# Patient Record
Sex: Male | Born: 1945 | Race: Black or African American | Hispanic: No | Smoking: Current every day smoker
Health system: Southern US, Community
[De-identification: ages and names within clinical notes are randomized; demographics above are authoritative.]

## PROBLEM LIST (undated history)

## (undated) DIAGNOSIS — I639 Cerebral infarction, unspecified: Secondary | ICD-10-CM

## (undated) DIAGNOSIS — E119 Type 2 diabetes mellitus without complications: Secondary | ICD-10-CM

## (undated) DIAGNOSIS — I10 Essential (primary) hypertension: Secondary | ICD-10-CM

---

## 2007-10-09 ENCOUNTER — Emergency Department: Payer: Self-pay | Admitting: Emergency Medicine

## 2007-10-09 ENCOUNTER — Ambulatory Visit: Payer: Self-pay | Admitting: Pulmonary Disease

## 2007-10-09 ENCOUNTER — Inpatient Hospital Stay (HOSPITAL_COMMUNITY): Admission: AD | Admit: 2007-10-09 | Discharge: 2007-11-10 | Payer: Self-pay | Admitting: Neurology

## 2007-10-28 ENCOUNTER — Ambulatory Visit: Payer: Self-pay | Admitting: Physical Medicine & Rehabilitation

## 2009-07-03 IMAGING — CR DG CHEST 1V PORT
1 series · 1 of 1 positions shown · non-contrast
Comparison: none

REASON FOR EXAM: pain
COMMENTS:

[view not recorded]
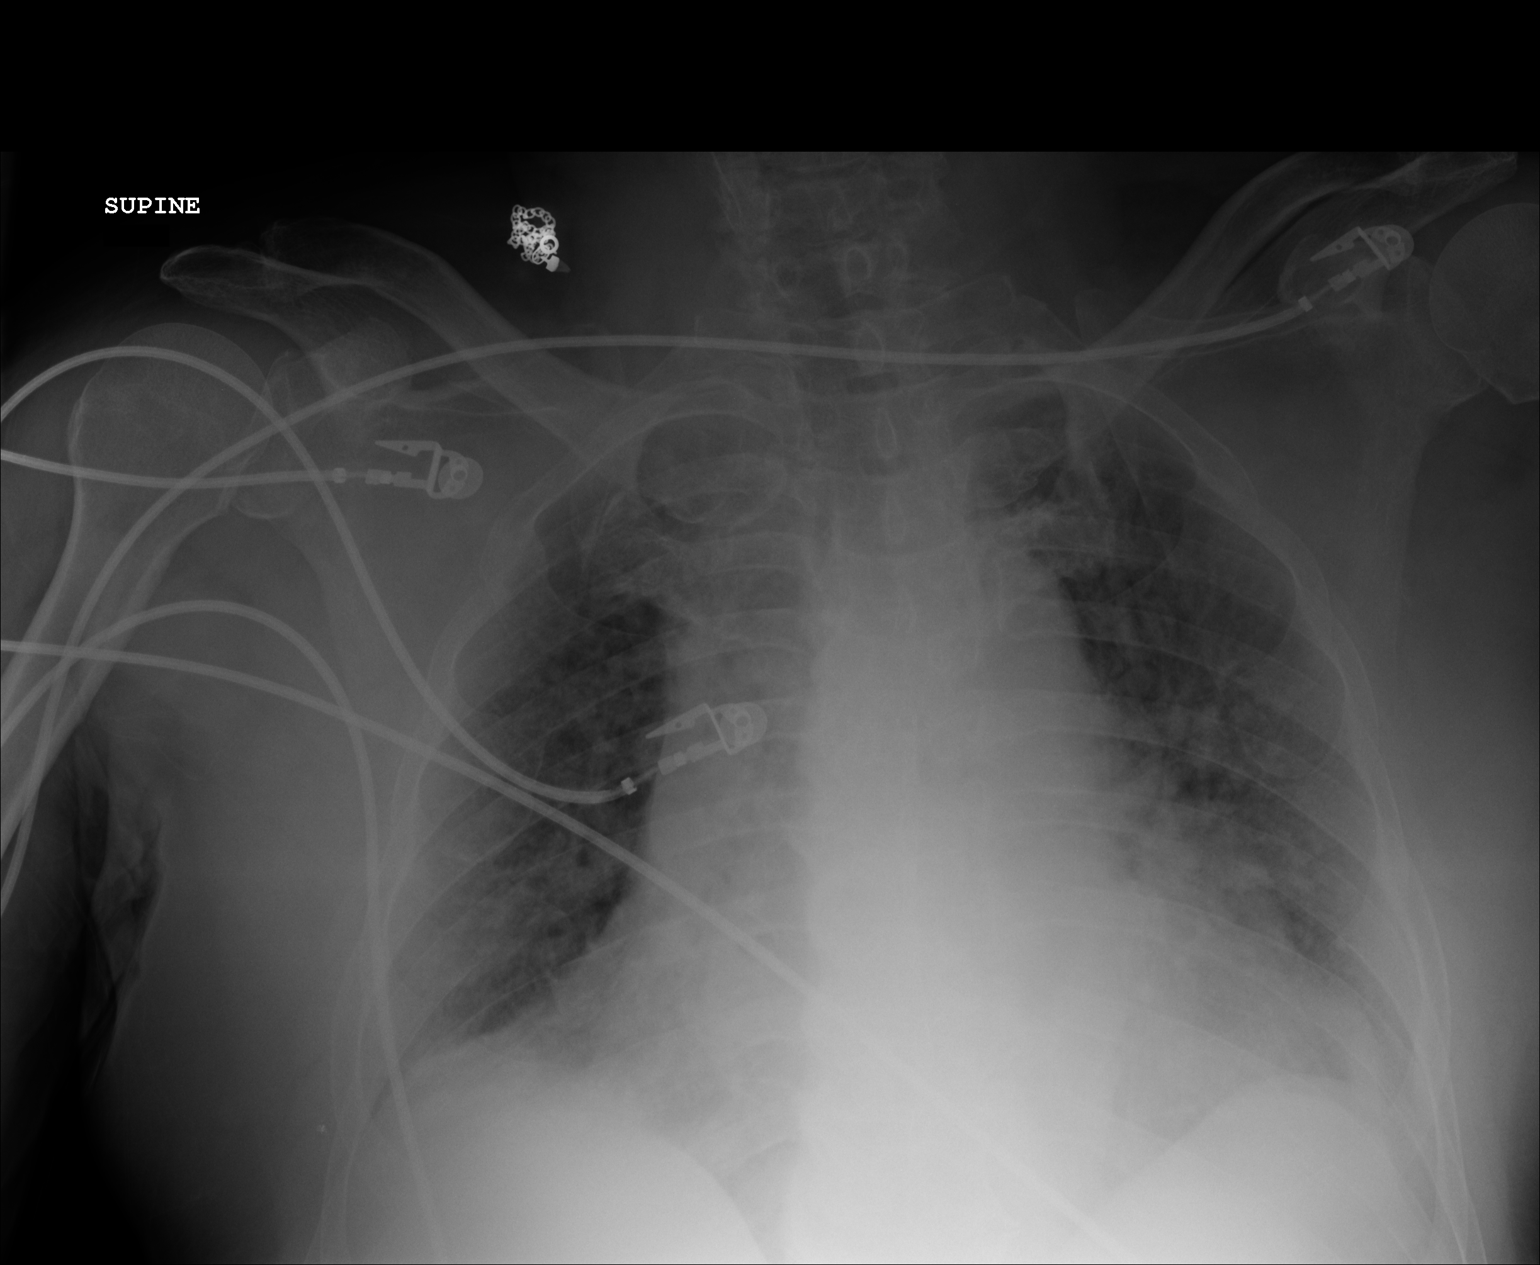

[1 of 1 positions shown; findings below may reference images not displayed]

PROCEDURE:     DXR - DXR PORTABLE CHEST SINGLE VIEW  - October 09, 2007  [DATE]

RESULT:     Portable AP view of the chest shows prominence of the pulmonary
vascularity compatible pulmonary vascular congestion. No pleural effusion or
frank pulmonary edema is seen. The heart is enlarged. Monitoring electrodes
are present.
IMPRESSION: 1. There is prominence of the pulmonary vascularity compatible pulmonary
vascular congestion.
2. No pneumonia is seen.
3. Cardiomegaly.

## 2009-07-06 IMAGING — CR DG ABD PORTABLE 1V
1 series · 1 of 1 positions shown · non-contrast
Comparison: 10/12/2007

CLINICAL DATA: Interventricular hemorrhage.  Pain the tube
placement.

ABDOMEN - 1 VIEW

[view not recorded]
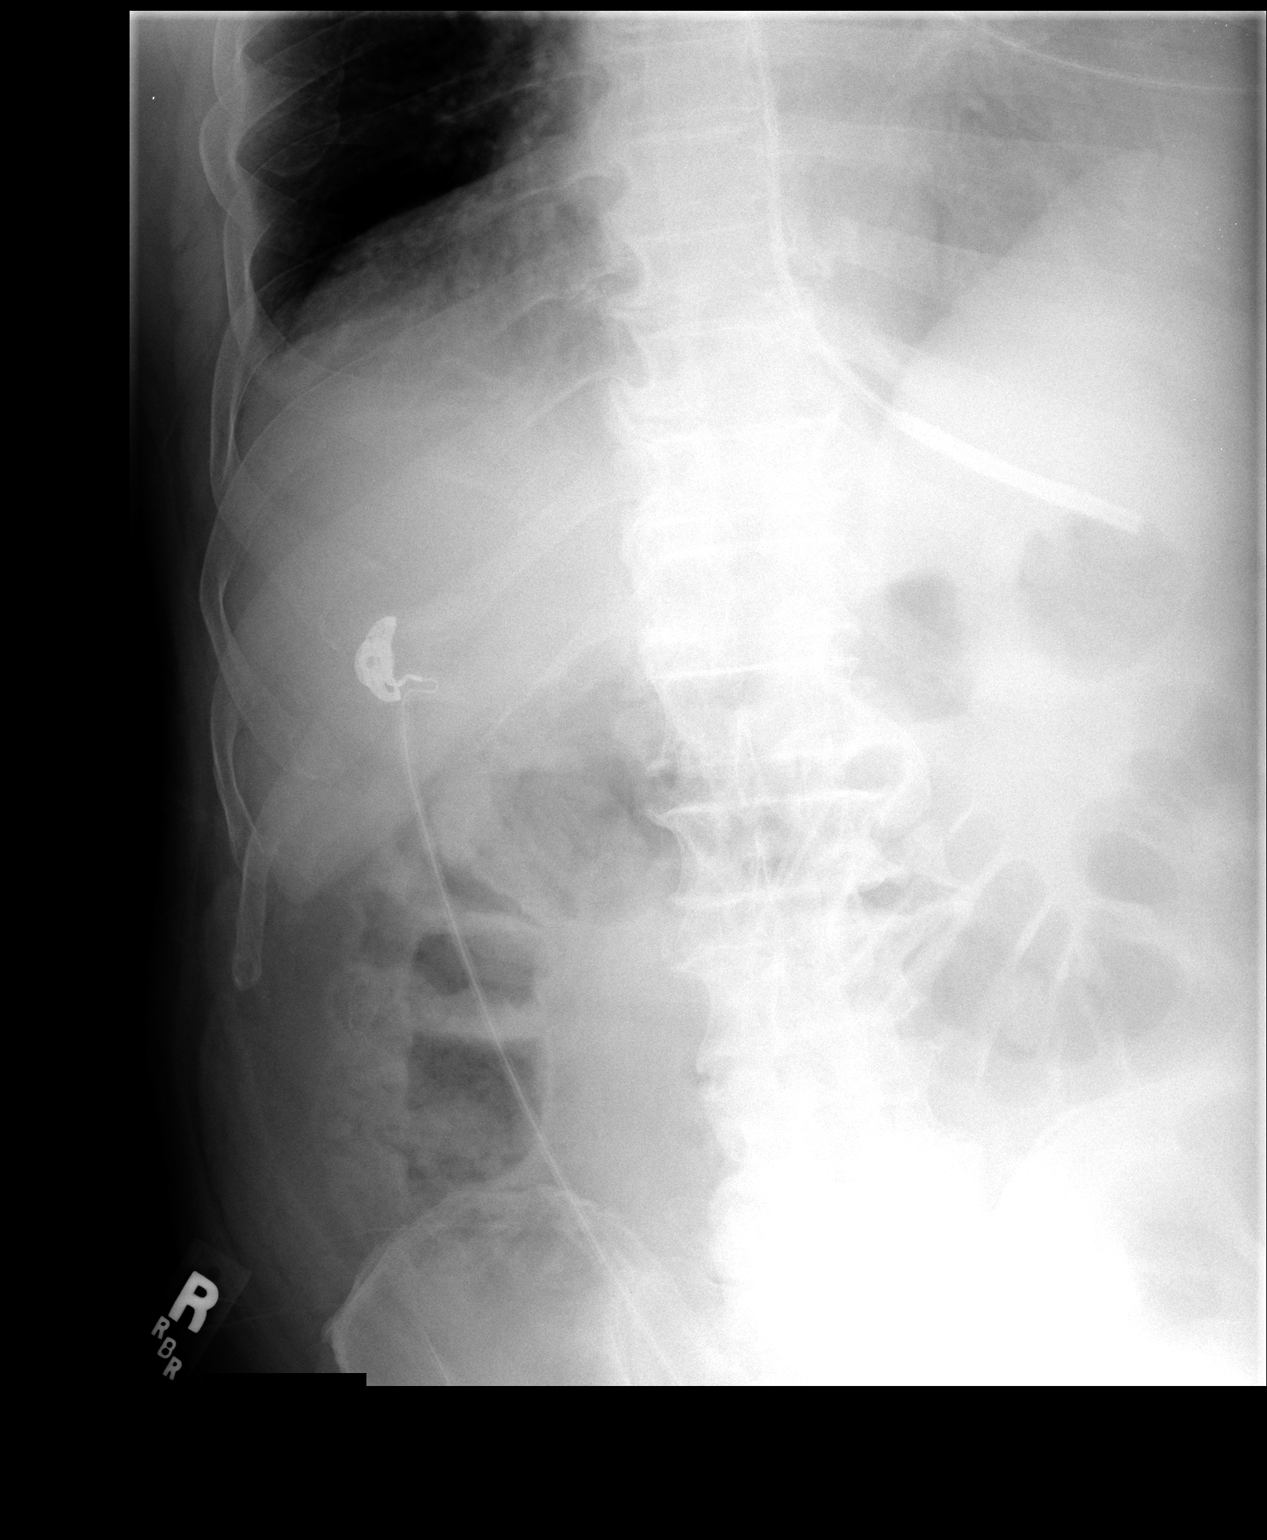

[1 of 1 positions shown; findings below may reference images not displayed]

FINDINGS: Portable exam is performed at 4964 hours.  Panda tube is
in place with tip to the level of the stomach.  Visualized bowel
gas pattern is nonobstructive.  Degenerative changes are noted in
the spine.
IMPRESSION: Panda tube tip to the proximal stomach.

## 2009-07-06 IMAGING — CR DG ABD PORTABLE 1V
1 series · 1 of 1 positions shown · non-contrast
Comparison: Same day

CLINICAL DATA: And a placement

ABDOMEN - 1 VIEW

[AP]
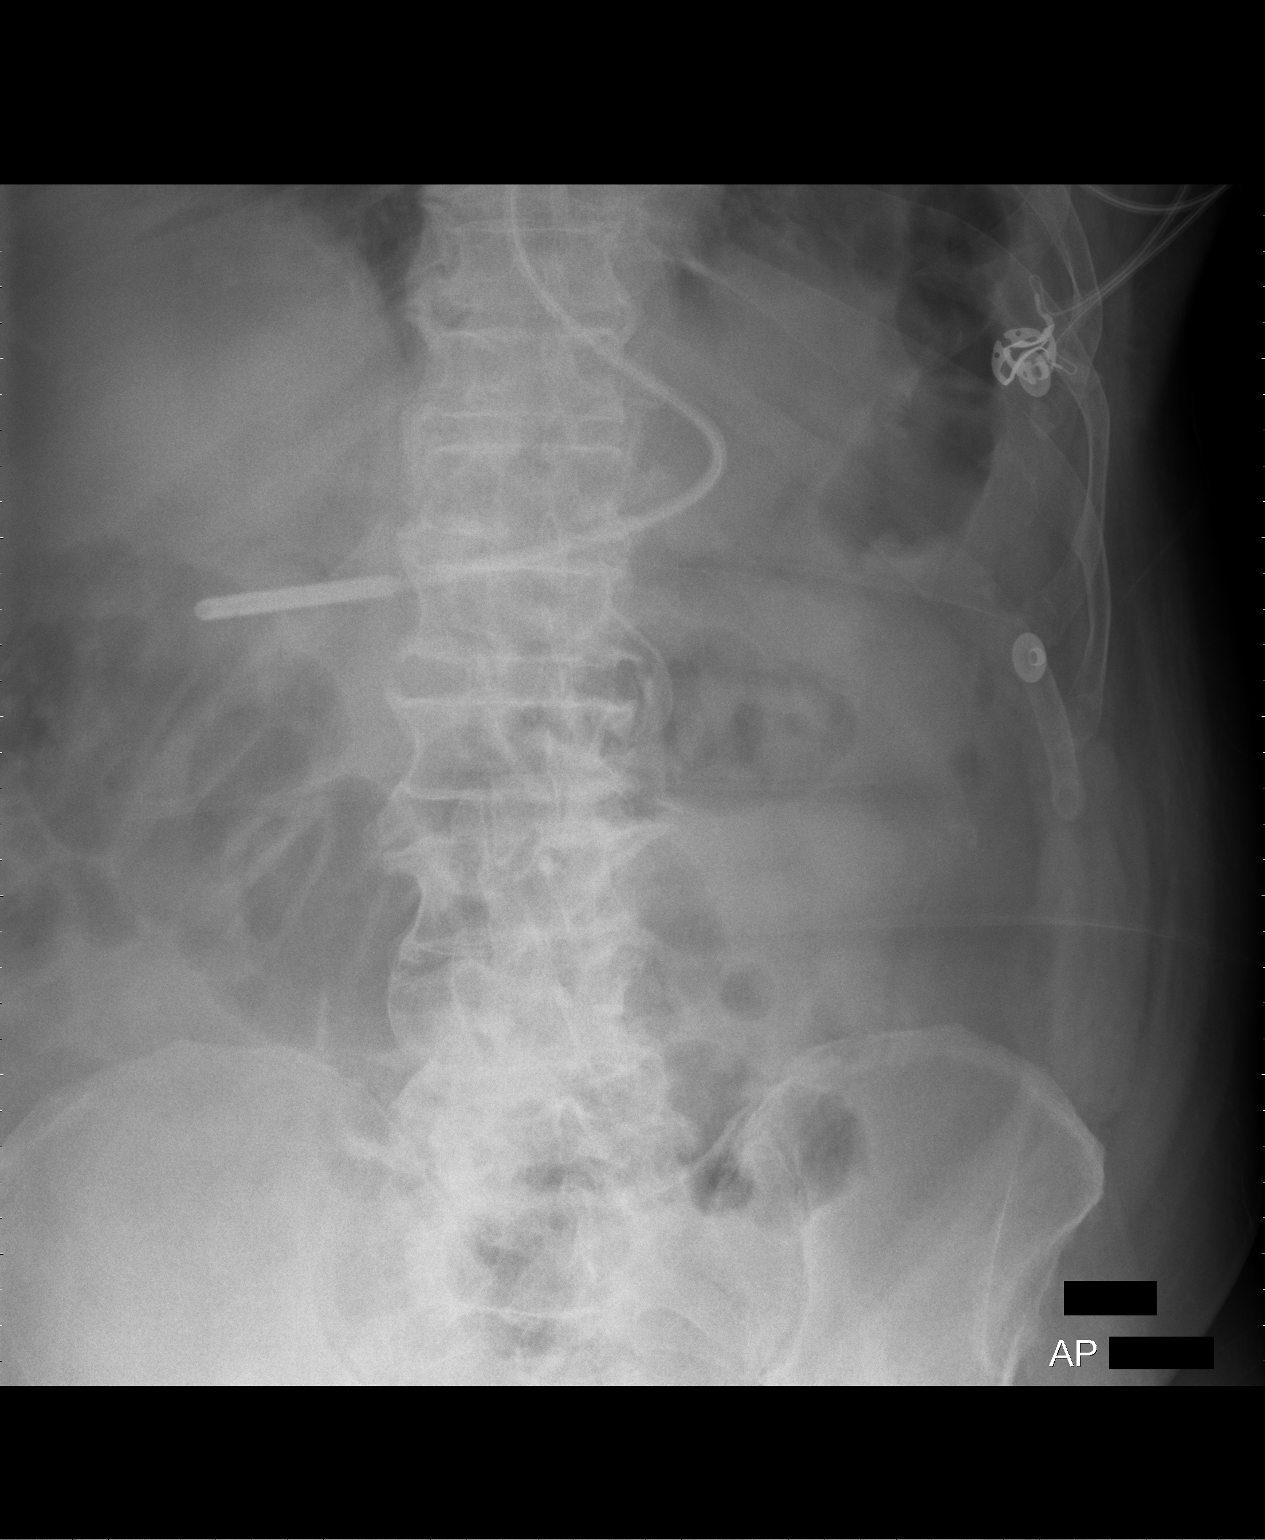

[1 of 1 positions shown; findings below may reference images not displayed]

FINDINGS: Soft feeding tube enters the stomach and has its tip at
the antrum or pylorus.

## 2009-07-08 IMAGING — CR DG CHEST 1V PORT
1 series · 1 of 1 positions shown · non-contrast
Comparison: 10/09/2007

CLINICAL DATA: Central line placement.

PORTABLE CHEST - 1 VIEW

[view not recorded]
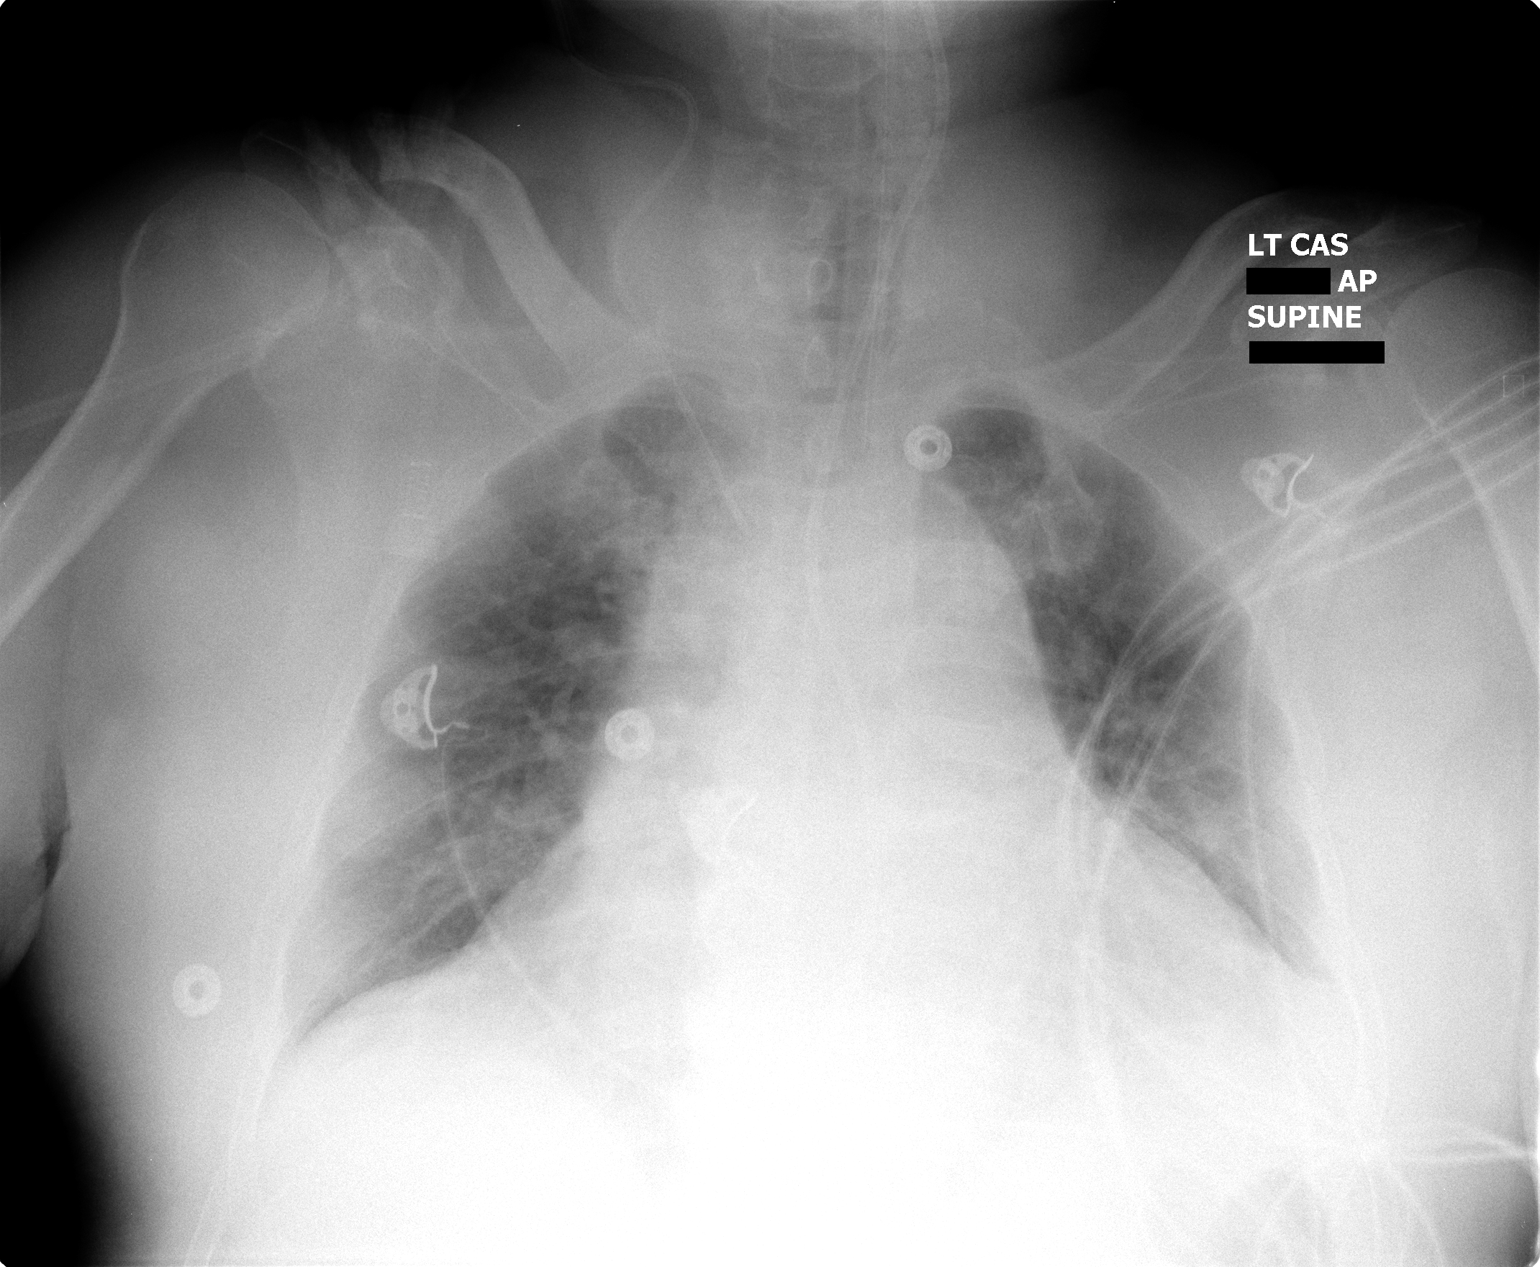

[1 of 1 positions shown; findings below may reference images not displayed]

FINDINGS: Endotracheal tube is in satisfactory position.  Feeding
tube is followed into the stomach with the tip projecting beyond
the inferior boundary the film.  Right IJ central line terminates
near the junction of the right internal jugular and right
subclavian veins.  No pneumothorax.  Heart is enlarged, stable.
Lungs are low in volume with left basilar atelectasis. Cannot
exclude left pleural effusion.
IMPRESSION: 1.  Right IJ central line placement, as above, without
pneumothorax.
2.  Left basilar atelectasis.
3.  Cannot exclude left pleural effusion.

## 2009-07-10 IMAGING — CR DG CHEST 1V PORT
1 series · 1 of 1 positions shown · non-contrast
Comparison: 10/15/2007.

CLINICAL DATA: Cerebral hemorrhage.

PORTABLE CHEST - 1 VIEW

[view not recorded]
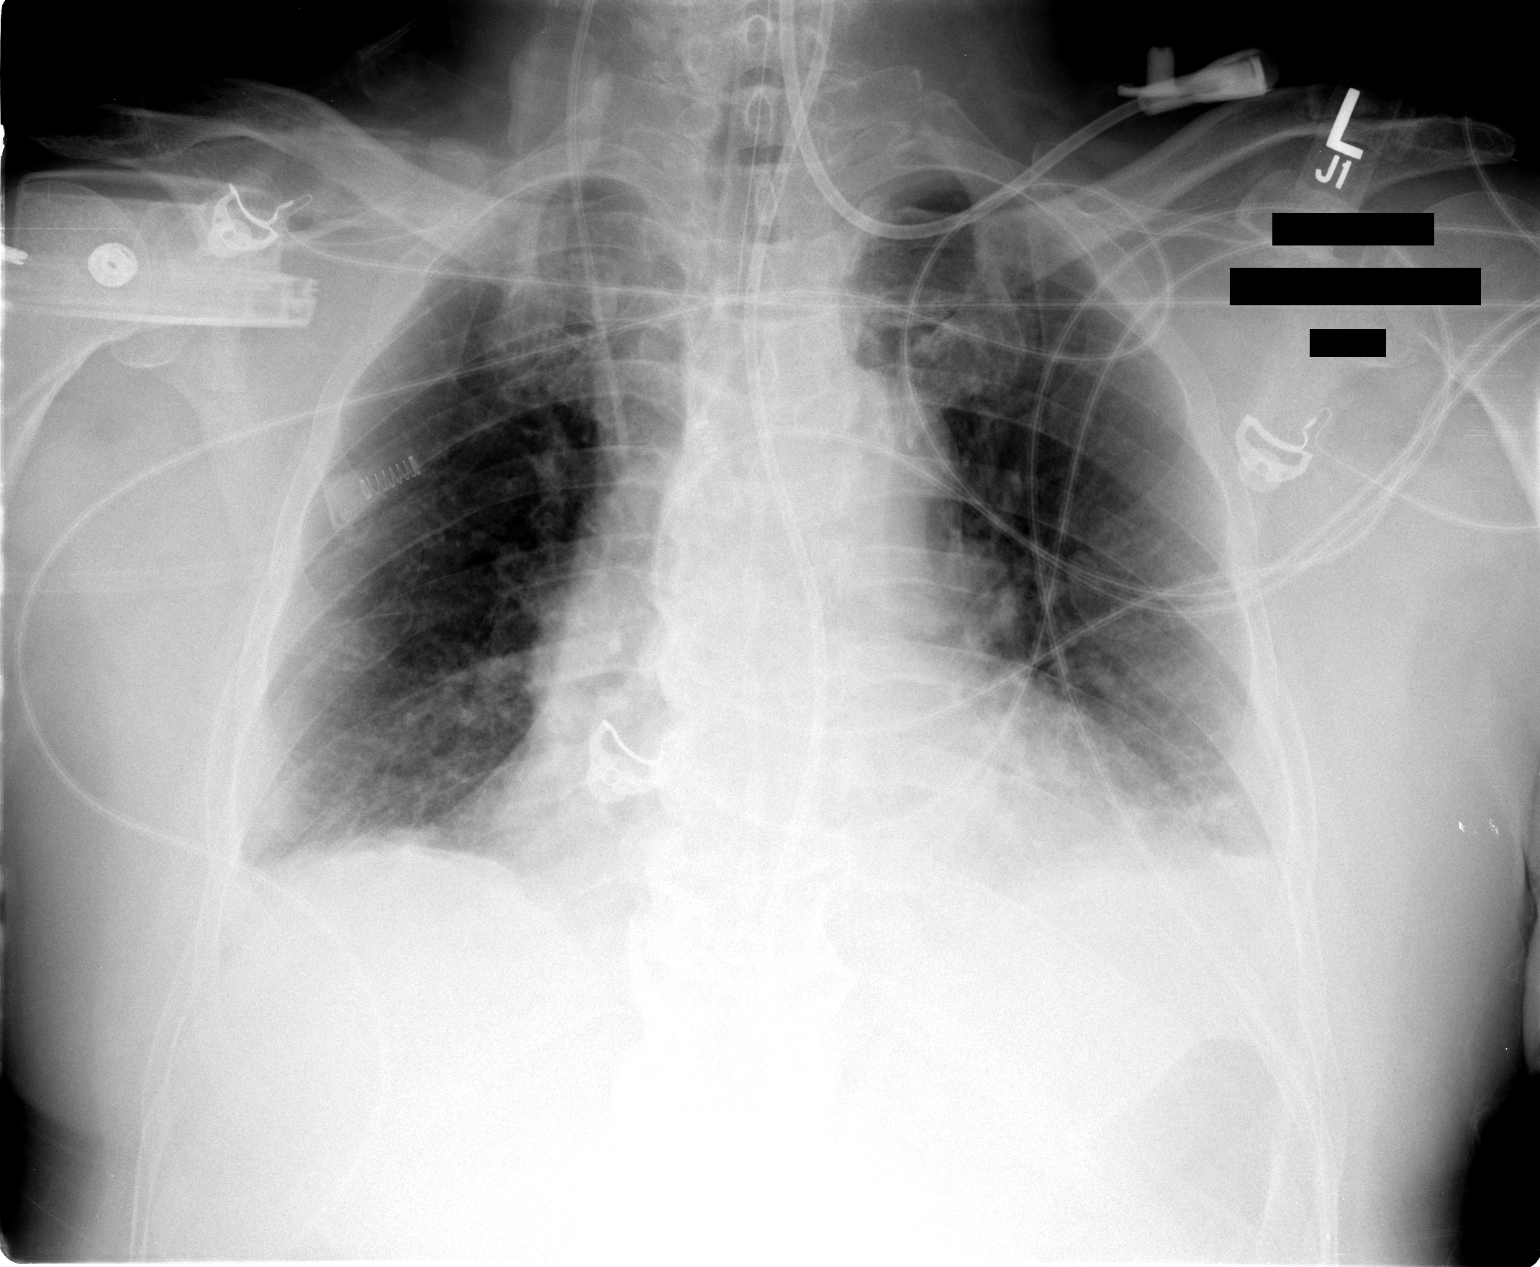

[1 of 1 positions shown; findings below may reference images not displayed]

FINDINGS: Endotracheal tube is in satisfactory position.  Feeding
tube is followed into the stomach with the tip projecting beyond
the inferior boundary the film.  Right IJ central line is in the
SVC.  Heart size stable.  Probable small left pleural effusion.
Bibasilar atelectasis.
IMPRESSION: Small left pleural effusion and bibasilar atelectasis.

## 2010-11-29 NOTE — Op Note (Signed)
NAMEGAREY, ALLEVA NO.:  [id number]   MEDICAL RECORD NO.:  [id number]          PATIENT TYPE:  INP   LOCATION:  3107                         FACILITY:  [facility]   PHYSICIAN:  Hilda Lias, M.D.   DATE OF BIRTH:  August 02, 1945   DATE OF PROCEDURE:  10/09/2007  DATE OF DISCHARGE:                               OPERATIVE REPORT   PREOPERATIVE DIAGNOSIS:  Intraventricular hemorrhage with hydrocephalus.   POSTOPERATIVE DIAGNOSIS:  Intraventricular hemorrhage with  hydrocephalus.   PROCEDURE:  Insertion of left intraventricular catheter for CSF  drainage.   SURGEON:  Hilda Lias, M.D.   CLINICAL HISTORY:  Mr. Daughdrill is a gentleman admitted to [facility] with intracerebral hematoma.  The patient has been  paralyzed on the left side with moving the right side.  CT scan showed  intraventricular hemorrhage going all the way down to the fourth  ventricle.  Also showed hydrocephalus.  Surgery for placement of a  catheter for spinal fluid drain was advised.  The family, who was  present, agreed with the procedure.   PROCEDURE:  The patient's head was shaved and the left side of the head  was cleaned with DuraPrep.  Incision away from the midline 2 cm and 2 cm  in front of the coronal suture was done.  Then with the drill, we  drilled the frontal bone until we got to the dura mater.  The dura mater  was opened with 18 gauge needle.  Then intraventricular catheter was  inserted which immediately the CSF came back at high pressure and  bloody.  The catheter was secured in place.  She is going to the  intraventricular catheter open to drain at 15 cm of water.           ______________________________  Hilda Lias, M.D.     EB/MEDQ  D:  10/09/2007  T:  10/10/2007  Job:  [id number]

## 2010-11-29 NOTE — Consult Note (Signed)
NAMEJAQUA, CHING NO.:  [id number]   MEDICAL RECORD NO.:  [id number]          PATIENT TYPE:  INP   LOCATION:  3107                         FACILITY:  [facility]   PHYSICIAN:  Hilda Lias, M.D.   DATE OF BIRTH:  [birth year]   DATE OF CONSULTATION:  10/10/2007  DATE OF DISCHARGE:                                 CONSULTATION   Barry Jensen was a gentleman who had transferred from [facility] because  of sudden onset of decreased level of consciousness, with headache,  nausea, vomiting.  At [facility] he had CT scan, and because of  findings he was transferred immediately to [facility] to the  stroke service.  We were called by Dr. Lesia Sago to help in the  treatment of Barry Jensen.  At the present time he is somnolent, he is  not following commands, he is __________ on the right side.  The eyes  are diverted to the right side.  CT scan showed intraventricular  hemorrhage already going into the fourth ventricle with  earlyhydrocephalus.   CLINICAL IMPRESSION:  Intrathecal hemorrhage with early hydrocephalus.  When I saw the patient, besides the treatment given by Dr. Anne Hahn, we  are going to proceed with insertion of intraventricular catheter.  I  talked to the family who were present.           ______________________________  Hilda Lias, M.D.     EB/MEDQ  D:  10/09/2007  T:  10/10/2007  Job:  [id number]

## 2010-11-29 NOTE — Discharge Summary (Signed)
Barry, Jensen              ACCOUNT NO.:  [id number]   MEDICAL RECORD NO.:  [id number]          PATIENT TYPE:  INP   LOCATION:  3005                         FACILITY:  [facility]   PHYSICIAN:  Pramod P. Pearlean Brownie, MD    DATE OF BIRTH:  1946-07-02   DATE OF ADMISSION:  10/09/2007  DATE OF DISCHARGE:  11/10/2007                               DISCHARGE SUMMARY   ADMISSION DIAGNOSIS:  Intracerebral hemorrhage.   DISCHARGE DIAGNOSES:  1. Primarily intraventricular hemorrhage secondary to hypertension      with obstructive hydrocephalus, status post ventriculostomy.  2. Dementia, secondary to intracerebral hemorrhage.   HOSPITAL COURSE:  Barry Jensen is a 65 year old Caucasian male who was  found to have sudden onset of headache, nausea, vomiting, and decreased  level of sensorium.  He was brought to [facility],  where a CAT scan was done which showed a large right-sided predominantly  intraventricular hemorrhage with an extension into the third and fourth  ventricle with mild evidence of obstruction of the ventricular system  and hydrocephalus.  He was transferred to [facility] Emergency Room,  where he was seen by Dr. Lesia Sago.  Kindly see his admission H&P for  details.  When he presented, his blood pressure was found to be  significantly elevated at 205/130.  He was admitted to the [facility]  Emergency Room, and initially he was found to be lethargic with  weakness, more on the left than the right side, but was still following  commands.  Subsequently, his neurological condition declined the next  morning, and he required intubation.  He was kept on IV Cardene drip for  blood pressure control.  His repeat CT scan showed worsening hemorrhage  with hydrocephalus.  Neurosurgery was consulted, and he underwent  intraventricular catheter placement by Dr. Newell Coral from neurosurgery.  His ventricle was in place for around 10 days and was draining.  After  the  ventricle was found to stop draining, the catheter was clamped and  followup CT scan was done 24 hours later, which showed no significant  worsening of the hydrocephalus and eventually the ventricle was  discontinued.  He was extubated eventually when he was found to be  breathing satisfactorily.  Critical care team followed him throughout  the hospital course for his pulmonary problems.  The patient got  extubated eventually and left the ICU.  He was started on multiple blood  pressure medications to keep it under control.  He was found to have  mild dementia.  He was disoriented to time but could follow on the  simple commands.  He had poor short-term memory, registration, and  recall.  He was found to have only minimum left hand and leg weakness  but significant problems with gait imbalance.  Physical, occupational  therapy, and speech therapy followed him throughout the hospital course.  The patient was not felt to be safe to go home and live alone.  Initially, it was difficult to get the family to take him home.  Hence,  arrangements were made for nursing home.  Finally, his wife arrived from  [place] and said she would take him home.  On the day of discharge, the  patient was pleasant, awake, alert, and cooperative.  He had mild  dementia.  He had mild left hand weakness and gait imbalance problems.  His vital signs on the day of discharge, blood pressure was 103/55.  His  blood pressure medications were held, and he was felt to do alright  later on during the day.  He still was afebrile with temperature 98.5,  pulse 82 per minute, and sats 95%.  CBC was normal.  Chemistry was  significant only for creatinine of 2.29, which was at his baseline.  The  patient was discharged home to the care of his wife and son.  Advised to  follow up with his primary physician in 2 weeks and with Dr. Pearlean Brownie in  his office in 2 months.   DISCHARGE MEDICATIONS:  1. Zocor 20 mg a day.  2.  Hydrochlorothiazide 25 mg a day.  3. Prinivil 40 mg a day.  4. Clonidine 0.2 mg twice a day.  5. Norvasc 10 mg once a day.  6. Protonix 40 mg daily.  7. Potassium 20 mg once a day.  8. Lopressor 100 mg twice a day.  9. Flomax 0.4 daily.  10.Colace 100 twice a day.   Arrangements were made for home physical and occupational therapy.  The  patient's diet at the time of discharge was regular diet with thin  liquids.           ______________________________  Sunny Schlein. Pearlean Brownie, MD     PPS/MEDQ  D:  11/10/2007  T:  11/11/2007  Job:  [id number]

## 2010-11-29 NOTE — H&P (Signed)
NAMEJAEVON, Jensen NO.:  [id number]   MEDICAL RECORD NO.:  [id number]          PATIENT TYPE:  INP   LOCATION:  3107                         FACILITY:  [facility]   PHYSICIAN:  Marlan Palau, M.D.  DATE OF BIRTH:  03/12/1946   DATE OF ADMISSION:  10/09/2007  DATE OF DISCHARGE:                              HISTORY & PHYSICAL   HISTORY OF PRESENT ILLNESS:  Barry Jensen is a 65 year old left-  handed black male born [birth year] with a history of lumbosacral  spine surgery previously.  Otherwise, no significant medical problems.  The patient moved from [place] area two  months ago to be closer to  family.  Was followed through the [facility] in [place].  The patient  was at home today and called his daughter around 1:30 complaining of  headache, nausea, vomiting.  The patient was found to quite diaphoretic,  sleepy, and nauseated.  The patient was brought to the [facility], and a CT scan of the brain was done showing evidence of a  fairly large right interventricular hemorrhage in the lateral ventricle  tracking into the third and fourth ventricles.  Patient has evidence of  some obstruction of the left lateral ventricle system.  The patient was  transferred to [facility] further management.   PAST MEDICAL HISTORY:  Significant for:  1. New onset right interventricular hemorrhage.  2. Hypertension that has been untreated.  3. Lumbosacral spine surgery.  4. Hemorrhoid surgery in the past.  5. Degenerative arthritis of the left knee.  6. Possible benign prostatic hypertrophy, difficulty voiding the      bladder.   The patient smokes a half pack of cigarettes daily.  Drinks alcohol in  the form of wine on a beta daily basis.  The patient was on no  medications prior to coming in.   SOCIAL HISTORY:  The patient is divorced, lives in the [place] area, and has five children who are alive and well.  The  patient is retired.   FAMILY MEDICAL HISTORY:  Noted mother died with cancer.  Father died  possibly of an MI.  Patient has five brothers and sisters.  One brother  died with cancer.  One daughter has a history of seizures.   REVIEW OF SYSTEMS:  Difficult to obtain.  The patient does complain of  headache.  Has had some nausea earlier.  The patient is quite lethargic  and difficult to arouse.   PHYSICAL EXAMINATION:  VITALS:  Blood pressure has been running in the  205/130 range, heart rate 84, respiratory rate 18, temperature afebrile.  In general, patient is a fairly well-developed black male who is  lethargic at the time examination.  HEENT EXAMINATION:  Head is atraumatic.  EYES:  Pupils round, reactive  to light.  Disks are flat.  NECK:  Has increased tone with head turn to the right.  RESPIRATORY EXAMINATION:  Is clear.  CARDIOVASCULAR EXAMINATION:  Reveals distant heart sounds.  No obvious  murmurs or rubs noted.  ABDOMEN:  Is large and nontender.  Positive bowel sounds noted.  EXTREMITIES:  Without significant edema, although 1+ edema is noted in  the ankles bilaterally.  NEUROLOGIC EXAMINATION:  Cranial nerves as above.  Facial symmetry is  difficult to ascertain.  The patient appears to have symmetric grimace.  Will talk at times but has significant dysarthria.  Does not  consistently follow commands.  The patient uses the right arm  spontaneously.  Does not use either leg well at all.  Can elevate the  left arm against gravity but refers to use the right arm.  Both arms  drift down to the bed when held up.  The patient again will not move  either leg spontaneously.  Cannot follow directions for cerebellar  testing.  Will not respond to testing for sensation.  Does not really  respond to pain stimulation on all fours.  The patient does not blink to  threat from either side.  Again, the patient has a very significant  right gaze preference.  Cannot get eyes across the  midline to the left.  Patient has a right Babinski, toes neutral on the left.   LABORATORY VALUES:  Done at [facility] include urine  drug screen that is negative, glucose of 145, BUN of 16, creatinine  0.99, sodium 141, potassium 3.7, chloride of 104, CO2 33, calcium of  8.6.  CK-MB of 2.1, total CK of 98, troponin I less than 0.1.  Alcohol  level 0.  The patient has a white count 11.8, hemoglobin of 16.1,  hematocrit 48.8, platelets of 222, MCV of 86, MCV of 86.  INR 1.0.  CT  scan of the head is as above.  Urinalysis reveals specific gravity of  1.010, pH of 7.0, protein 75 mg/dL, 0 to 5 red cells and white cells.   EKG reveals normal sinus rhythm, possible left atrial enlargement, left  ventricular hypertrophy, ST-T wave abnormalities, consider inferior  ischemia, heart rate of 74.   IMPRESSION:  1. History of intracranial hemorrhage involving the right lateral      ventricle with extension to the third and fourth ventricles with      obstructive tendencies.  2. Severe hypertension refractory to IV medications so far.   This patient has received labetalol and hydralazine without impact on  blood pressure.  Patient is lethargic, and he appears to have a weakness  on the left greater than right side not moving either leg very well,  likely secondary to intraventricular enlargement.  The patient will be  admitted to the [facility] in the neuroscience intensive care  unit. 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   PLAN:  1. Admission to [facility].  2. CT of the head tonight with a CT angiogram of intracranial vessels.  3. Neurosurgery evaluation.  4. Consider elective intubation given the lethargy, prior history of      nausea and vomiting today to help protect airway.  Will follow      neuro status closely.  The patient will go on IV Cardene drip for      blood pressure.  Will be n.p.o. for now.  Prognosis remains guarded      at this time.       Marlan Palau, M.D.  Electronically Signed     CKW/MEDQ  D:  10/09/2007  T:  10/10/2007  Job:  [id number]   cc:   Hilda Lias, M.D.

## 2011-04-10 LAB — LIPID PANEL
Cholesterol: 196
HDL: 53
Total CHOL/HDL Ratio: 3.7
Triglycerides: 44

## 2011-04-10 LAB — BLOOD GAS, ARTERIAL
Acid-Base Excess: 1
Bicarbonate: 24.6 — ABNORMAL HIGH
Drawn by: 277361
FIO2: 0.4
FIO2: 1
MECHVT: 600
O2 Saturation: 98.6
O2 Saturation: 98.8
PEEP: 5
Patient temperature: 102.5
Patient temperature: 99
RATE: 14
TCO2: 25.5
pCO2 arterial: 30.1 — ABNORMAL LOW
pH, Arterial: 7.242 — ABNORMAL LOW
pH, Arterial: 7.524 — ABNORMAL HIGH
pO2, Arterial: 129 — ABNORMAL HIGH
pO2, Arterial: 196 — ABNORMAL HIGH

## 2011-04-10 LAB — COMPREHENSIVE METABOLIC PANEL
AST: 20
Albumin: 3.4 — ABNORMAL LOW
BUN: 12
Calcium: 8.5
Chloride: 102
Creatinine, Ser: 0.95
GFR calc Af Amer: >60
Total Bilirubin: 0.8

## 2011-04-10 LAB — DIFFERENTIAL
Basophils Absolute: 0
Eosinophils Relative: 0
Lymphocytes Relative: 6 — ABNORMAL LOW
Lymphs Abs: 0.7
Monocytes Absolute: 0.5
Neutro Abs: 9.5 — ABNORMAL HIGH

## 2011-04-10 LAB — BASIC METABOLIC PANEL
BUN: 12
CO2: 26
CO2: 29
Calcium: 8.1 — ABNORMAL LOW
Calcium: 8.3 — ABNORMAL LOW
Calcium: 8.6
Creatinine, Ser: 0.84
Creatinine, Ser: 0.91
Creatinine, Ser: 1.18
GFR calc Af Amer: >60
GFR calc Af Amer: >60
GFR calc Af Amer: >60
GFR calc non Af Amer: >60
GFR calc non Af Amer: >60
Sodium: 139

## 2011-04-10 LAB — CBC
HCT: 46
Hemoglobin: 13.6
MCHC: 32.9
MCHC: 33
MCHC: 33.6
MCV: 85.2
Platelets: 187
RBC: 4.74
RBC: 5.24
RDW: 14.3
WBC: 10.6 — ABNORMAL HIGH
WBC: 12.9 — ABNORMAL HIGH
WBC: 9.3

## 2011-04-10 LAB — URINALYSIS, ROUTINE W REFLEX MICROSCOPIC
Glucose, UA: NEGATIVE
Glucose, UA: NEGATIVE
Ketones, ur: 15 — AB
Ketones, ur: NEGATIVE
Protein, ur: 30 — AB
Specific Gravity, Urine: 1.02
Urobilinogen, UA: 0.2
pH: 5.5

## 2011-04-10 LAB — URINE MICROSCOPIC-ADD ON

## 2011-04-10 LAB — URINE CULTURE
Colony Count: NO GROWTH
Culture: NO GROWTH
Special Requests: NEGATIVE

## 2011-04-10 LAB — CULTURE, BAL-QUANTITATIVE W GRAM STAIN: Colony Count: >=100000

## 2011-04-10 LAB — CULTURE, BLOOD (ROUTINE X 2)

## 2011-04-11 LAB — CBC
HCT: 31.8 — ABNORMAL LOW
HCT: 35.2 — ABNORMAL LOW
HCT: 39
HCT: 40
HCT: 40.1
Hemoglobin: 11.1 — ABNORMAL LOW
Hemoglobin: 12.3 — ABNORMAL LOW
Hemoglobin: 12.6 — ABNORMAL LOW
Hemoglobin: 12.8 — ABNORMAL LOW
Hemoglobin: 13.4
MCHC: 32.9
MCHC: 33
MCHC: 33.1
MCHC: 33.4
MCHC: 34
MCHC: 33.2
MCV: 85.7
MCV: 86
MCV: 86.2
MCV: 86.3
MCV: 86.4
MCV: 86.4
MCV: 86.5
Platelets: 212
Platelets: 227
Platelets: 261
Platelets: 473 — ABNORMAL HIGH
Platelets: 214
RBC: 4.3
RBC: 4.44
RBC: 4.47
RBC: 4.64
RBC: 4.94
RDW: 13.5
RDW: 13.7
RDW: 14.2
RDW: 14.2
WBC: 11 — ABNORMAL HIGH
WBC: 12.5 — ABNORMAL HIGH
WBC: 12.5 — ABNORMAL HIGH
WBC: 8.4

## 2011-04-11 LAB — BASIC METABOLIC PANEL
BUN: 12
BUN: 14
BUN: 18
BUN: 19
BUN: 5 — ABNORMAL LOW
BUN: 27 — ABNORMAL HIGH
CO2: 27
CO2: 27
CO2: 28
CO2: 33 — ABNORMAL HIGH
CO2: 27
Calcium: 7.9 — ABNORMAL LOW
Calcium: 8.6
Calcium: 8.9
Calcium: 9
Calcium: 9.2
Chloride: 100
Chloride: 103
Chloride: 106
Chloride: 93 — ABNORMAL LOW
Chloride: 94 — ABNORMAL LOW
Chloride: 94 — ABNORMAL LOW
Chloride: 96
Chloride: 97
Creatinine, Ser: 0.65
Creatinine, Ser: 0.82
Creatinine, Ser: 0.92
Creatinine, Ser: 0.97
Creatinine, Ser: 2.29 — ABNORMAL HIGH
GFR calc Af Amer: >60
GFR calc Af Amer: >60
GFR calc Af Amer: >60
GFR calc Af Amer: >60
GFR calc Af Amer: >60
GFR calc Af Amer: 35 — ABNORMAL LOW
GFR calc non Af Amer: >60
GFR calc non Af Amer: >60
Glucose, Bld: 112 — ABNORMAL HIGH
Glucose, Bld: 115 — ABNORMAL HIGH
Glucose, Bld: 135 — ABNORMAL HIGH
Glucose, Bld: 143 — ABNORMAL HIGH
Glucose, Bld: 147 — ABNORMAL HIGH
Potassium: 3.5
Potassium: 3.5
Potassium: 3.9
Potassium: 4.5
Sodium: 132 — ABNORMAL LOW
Sodium: 133 — ABNORMAL LOW
Sodium: 136
Sodium: 137
Sodium: 138

## 2011-04-11 LAB — BLOOD GAS, ARTERIAL
Bicarbonate: 25.1 — ABNORMAL HIGH
Bicarbonate: 28.2 — ABNORMAL HIGH
Bicarbonate: 31.7 — ABNORMAL HIGH
MECHVT: 0.6
O2 Saturation: 96.7
PEEP: 5
PEEP: 5
PEEP: 5
Patient temperature: 101.1
Patient temperature: 101.4
RATE: 14
TCO2: 26.3
TCO2: 29.5
TCO2: 29.5
TCO2: 33.2
pCO2 arterial: 42.4
pCO2 arterial: 46.1 — ABNORMAL HIGH
pCO2 arterial: 48.1 — ABNORMAL HIGH
pCO2 arterial: 48.5 — ABNORMAL HIGH
pH, Arterial: 7.399
pH, Arterial: 7.402
pH, Arterial: 7.435
pH, Arterial: 7.437
pO2, Arterial: 104 — ABNORMAL HIGH
pO2, Arterial: 88.2

## 2011-04-11 LAB — DIFFERENTIAL
Basophils Absolute: 0
Basophils Relative: 1
Eosinophils Absolute: 0.3
Monocytes Relative: 12
Neutro Abs: 2.9
Neutrophils Relative %: 54

## 2011-04-11 LAB — APTT: aPTT: 31

## 2011-04-11 LAB — PHENYTOIN LEVEL, TOTAL: Phenytoin Lvl: <2.5 — ABNORMAL LOW

## 2011-04-11 LAB — PROTIME-INR: INR: 1

## 2012-06-14 ENCOUNTER — Encounter (HOSPITAL_COMMUNITY): Payer: Self-pay | Admitting: Physical Medicine and Rehabilitation

## 2012-06-14 ENCOUNTER — Emergency Department (HOSPITAL_COMMUNITY): Payer: Medicare Other

## 2012-06-14 ENCOUNTER — Emergency Department (HOSPITAL_COMMUNITY)
Admission: EM | Admit: 2012-06-14 | Discharge: 2012-06-14 | Disposition: A | Discharge status: Home / Self Care | Payer: Medicare Other | Attending: Emergency Medicine | Admitting: Emergency Medicine

## 2012-06-14 DIAGNOSIS — I10 Essential (primary) hypertension: Secondary | ICD-10-CM | POA: Insufficient documentation

## 2012-06-14 DIAGNOSIS — H9319 Tinnitus, unspecified ear: Secondary | ICD-10-CM

## 2012-06-14 DIAGNOSIS — F17200 Nicotine dependence, unspecified, uncomplicated: Secondary | ICD-10-CM | POA: Insufficient documentation

## 2012-06-14 DIAGNOSIS — Z79899 Other long term (current) drug therapy: Secondary | ICD-10-CM | POA: Insufficient documentation

## 2012-06-14 DIAGNOSIS — N390 Urinary tract infection, site not specified: Secondary | ICD-10-CM

## 2012-06-14 HISTORY — DX: Essential (primary) hypertension: I10

## 2012-06-14 LAB — URINALYSIS, ROUTINE W REFLEX MICROSCOPIC
Nitrite: NEGATIVE
Protein, ur: NEGATIVE mg/dL
Specific Gravity, Urine: 1.027 (ref 1.005–1.030)
Urobilinogen, UA: 0.2 mg/dL (ref 0.0–1.0)

## 2012-06-14 LAB — POCT I-STAT, CHEM 8
HCT: 44 % (ref 39.0–52.0)
Hemoglobin: 15 g/dL (ref 13.0–17.0)
Potassium: 4 mEq/L (ref 3.5–5.1)
Sodium: 144 mEq/L (ref 135–145)
TCO2: 23 mmol/L (ref 0–100)

## 2012-06-14 LAB — URINE MICROSCOPIC-ADD ON

## 2012-06-14 MED ORDER — CIPROFLOXACIN HCL 500 MG PO TABS: 500.0000 mg | ORAL_TABLET | Freq: Two times a day (BID) | ORAL | Status: AC

## 2012-06-14 NOTE — ED Notes (Signed)
Pt presents to department for evaluation of bilateral tinnitus. Onset yesterday afternoon. Pt also states blurred vision. Denies headache, no nausea/vomiting. Pt is conscious alert and oriented x4. Denies pain at the present. No neurological deficits noted. Ambulatory without difficulty.

## 2012-06-14 NOTE — ED Notes (Signed)
Ringing of ears stopped

## 2012-06-14 NOTE — ED Provider Notes (Signed)
History     CSN: [id number]  Arrival date & time 06/14/12  4098   First MD Initiated Contact with Patient 06/14/12 [phone]      Chief Complaint  Patient presents with  . Tinnitus    (Consider location/radiation/quality/duration/timing/severity/associated sxs/prior treatment) HPI Comments: Patient presents with bilateral ringing in his ears. He states that his hearing was dulled yesterday but it's back to normal today. He states that the ringing started yesterday while he was drinking alcohol watching a football game. He currently denies any headache. Denies any nausea vomiting. Denies any gait difficulties. Denies any numbness or weakness in his extremities. Denies any nasal congestion or facial pain. Denies any chest pain or shortness of breath. He does have a history of an intraventricular hemorrhage in 2009 where he also had ringing in his ears but this is associated with an intense headache. He states she's had some ringing in his ears intermittently since then but it typically goes away on its own.   Past Medical History  Diagnosis Date  . Hypertension     No past surgical history on file.  History reviewed. No pertinent family history.  History  Substance Use Topics  . Smoking status: Current Every Day Smoker    Types: Cigarettes  . Smokeless tobacco: Not on file  . Alcohol Use: Yes      Review of Systems  Constitutional: Negative for fever, chills, diaphoresis and fatigue.  HENT: Positive for tinnitus. Negative for ear pain, congestion, rhinorrhea and sneezing.   Eyes: Negative.   Respiratory: Negative for cough, chest tightness and shortness of breath.   Cardiovascular: Negative for chest pain and leg swelling.  Gastrointestinal: Negative for nausea, vomiting, abdominal pain, diarrhea and blood in stool.  Genitourinary: Negative for frequency, hematuria, flank pain and difficulty urinating.  Musculoskeletal: Negative for back pain, arthralgias and gait problem.    Skin: Negative for rash.  Neurological: Negative for dizziness, seizures, syncope, facial asymmetry, speech difficulty, weakness, numbness and headaches.    Allergies  Terazosin  Home Medications   Current Outpatient Rx  Name  Route  Sig  Dispense  Refill  . ASPIRIN 81 MG PO CHEW   Oral   Chew 81 mg by mouth daily.         . ATENOLOL 50 MG PO TABS   Oral   Take 100 mg by mouth daily.         . CYCLOBENZAPRINE HCL 10 MG PO TABS   Oral   Take 10 mg by mouth 3 (three) times daily as needed. For muscle spasms         . FUROSEMIDE 40 MG PO TABS   Oral   Take by mouth daily.         . IBUPROFEN 600 MG PO TABS   Oral   Take 600 mg by mouth 2 (two) times daily as needed. For pain         . LACTULOSE 10 GM/15ML PO SOLN   Oral   Take 10 g by mouth 3 (three) times daily as needed. For constipation         . MORPHINE SULFATE 30 MG PO TABS   Oral   Take 30 mg by mouth every 8 (eight) hours as needed. For pain         . NIFEDIPINE ER OSMOTIC 30 MG PO TB24   Oral   Take 30 mg by mouth daily.         Marland Kitchen RANITIDINE HCL 150  MG PO TABS   Oral   Take 150 mg by mouth 2 (two) times daily.         Bernadette Hoit SODIUM 8.6-50 MG PO TABS   Oral   Take 1 tablet by mouth 2 (two) times daily.         . SERTRALINE HCL 100 MG PO TABS   Oral   Take 100 mg by mouth daily.         Marland Kitchen TAMSULOSIN HCL 0.4 MG PO CAPS   Oral   Take 0.4 mg by mouth daily.         . TOPIRAMATE 25 MG PO TABS   Oral   Take 25 mg by mouth at bedtime.         Marland Kitchen CIPROFLOXACIN HCL 500 MG PO TABS   Oral   Take 1 tablet (500 mg total) by mouth every 12 (twelve) hours.   14 tablet   0     BP 179/106  Pulse 102  Temp 98 F (36.7 C) (Oral)  Resp 24  SpO2 95%  Physical Exam  Constitutional: He is oriented to person, place, and time. He appears well-developed and well-nourished.  HENT:  Head: Normocephalic and atraumatic.  Eyes: Pupils are equal, round, and reactive to  light.  Neck: Normal range of motion. Neck supple.  Cardiovascular: Normal rate, regular rhythm and normal heart sounds.   Pulmonary/Chest: Effort normal and breath sounds normal. No respiratory distress. He has no wheezes. He has no rales. He exhibits no tenderness.  Abdominal: Soft. Bowel sounds are normal. There is no tenderness. There is no rebound and no guarding.  Musculoskeletal: Normal range of motion. He exhibits no edema.  Lymphadenopathy:    He has no cervical adenopathy.  Neurological: He is alert and oriented to person, place, and time. He has normal strength. No cranial nerve deficit or sensory deficit. GCS eye subscore is 4. GCS verbal subscore is 5. GCS motor subscore is 6.       No gait disturbance. Finger to nose intact  Skin: Skin is warm and dry. No rash noted.  Psychiatric: He has a normal mood and affect.    ED Course  Procedures (including critical care time)  Results for orders placed during the hospital encounter of 06/14/12  URINALYSIS, ROUTINE W REFLEX MICROSCOPIC      Component Value Range   Color, Urine YELLOW  YELLOW   APPearance CLEAR  CLEAR   Specific Gravity, Urine 1.027  1.005 - 1.030   pH 5.5  5.0 - 8.0   Glucose, UA NEGATIVE  NEGATIVE mg/dL   Hgb urine dipstick MODERATE (*) NEGATIVE   Bilirubin Urine NEGATIVE  NEGATIVE   Ketones, ur NEGATIVE  NEGATIVE mg/dL   Protein, ur NEGATIVE  NEGATIVE mg/dL   Urobilinogen, UA 0.2  0.0 - 1.0 mg/dL   Nitrite NEGATIVE  NEGATIVE   Leukocytes, UA MODERATE (*) NEGATIVE  POCT I-STAT, CHEM 8      Component Value Range   Sodium 144  135 - 145 mEq/L   Potassium 4.0  3.5 - 5.1 mEq/L   Chloride 110  96 - 112 mEq/L   BUN 21  6 - 23 mg/dL   Creatinine, Ser 1.61  0.50 - 1.35 mg/dL   Glucose, Bld 096 (*) 70 - 99 mg/dL   Calcium, Ion 0.45  4.09 - 1.30 mmol/L   TCO2 23  0 - 100 mmol/L   Hemoglobin 15.0  13.0 - 17.0 g/dL   HCT  44.0  39.0 - 52.0 %  URINE MICROSCOPIC-ADD ON      Component Value Range   Squamous  Epithelial / LPF RARE  RARE   WBC, UA 11-20  <3 WBC/hpf   RBC / HPF 7-10  <3 RBC/hpf   Bacteria, UA RARE  RARE   Crystals CA OXALATE CRYSTALS (*) NEGATIVE   Urine-Other MUCOUS PRESENT     Ct Head Wo Contrast  06/14/2012  *RADIOLOGY REPORT*  Clinical Data: Bilateraltinnitus, photophobia, history of ventricular hemorrhage.  CT HEAD WITHOUT CONTRAST  Technique:  Contiguous axial images were obtained from the base of the skull through the vertex without contrast.  Comparison: 10/21/2007  Findings: No skull fracture is noted.  Paranasal sinuses and mastoid air cells are unremarkable.  No intracranial hemorrhage, mass effect or midline shift.  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  No intraventricular hemorrhage.  There is a punctate calcification in the left basal ganglia.  Mild periventricular white matter decreased attenuation probable due to small vessel ischemic changes.  Small amount of encephalomalacia is noted in the right frontal lobe anteriorly at the site of the previous ventriculostomy drainage catheter. No acute cortical infarction.  No mass lesion is noted on this unenhanced scan.  IMPRESSION: No acute intracranial abnormality.  Mild periventricular white matter decreased attenuation probable due to chronic small vessel ischemic changes.  No acute cortical infarction.   Original Report Authenticated By: Natasha Mead, M.D.       1. Tinnitus   2. UTI (lower urinary tract infection)       MDM  Pt with tinnitus.  No hearing loss.  No other neuro deficits.  Gait normal.  CT neg for bleed.  Feel that pt can be discharged with outpt f/u.  Will send urine for culture and tx with abx.  Advised pt to have urine rechecked by PMD to make sure that blood clears.        Rolan Bucco, MD 06/14/12 1105

## 2012-06-15 LAB — URINE CULTURE: Special Requests: NORMAL

## 2015-01-06 ENCOUNTER — Encounter: Payer: Self-pay | Admitting: Emergency Medicine

## 2015-01-06 ENCOUNTER — Emergency Department: Payer: Medicare Other

## 2015-01-06 ENCOUNTER — Emergency Department
Admission: EM | Admit: 2015-01-06 | Discharge: 2015-01-06 | Disposition: A | Discharge status: Home / Self Care | Payer: Medicare Other | Attending: Emergency Medicine | Admitting: Emergency Medicine

## 2015-01-06 DIAGNOSIS — E119 Type 2 diabetes mellitus without complications: Secondary | ICD-10-CM | POA: Diagnosis not present

## 2015-01-06 DIAGNOSIS — W1839XA Other fall on same level, initial encounter: Secondary | ICD-10-CM | POA: Insufficient documentation

## 2015-01-06 DIAGNOSIS — Y9289 Other specified places as the place of occurrence of the external cause: Secondary | ICD-10-CM | POA: Diagnosis not present

## 2015-01-06 DIAGNOSIS — S99912A Unspecified injury of left ankle, initial encounter: Secondary | ICD-10-CM | POA: Diagnosis present

## 2015-01-06 DIAGNOSIS — Z720 Tobacco use: Secondary | ICD-10-CM | POA: Diagnosis not present

## 2015-01-06 DIAGNOSIS — S82402A Unspecified fracture of shaft of left fibula, initial encounter for closed fracture: Secondary | ICD-10-CM

## 2015-01-06 DIAGNOSIS — I10 Essential (primary) hypertension: Secondary | ICD-10-CM | POA: Diagnosis not present

## 2015-01-06 DIAGNOSIS — S82832A Other fracture of upper and lower end of left fibula, initial encounter for closed fracture: Secondary | ICD-10-CM | POA: Diagnosis not present

## 2015-01-06 DIAGNOSIS — S82892A Other fracture of left lower leg, initial encounter for closed fracture: Secondary | ICD-10-CM

## 2015-01-06 DIAGNOSIS — Z7982 Long term (current) use of aspirin: Secondary | ICD-10-CM | POA: Diagnosis not present

## 2015-01-06 DIAGNOSIS — Z79899 Other long term (current) drug therapy: Secondary | ICD-10-CM | POA: Insufficient documentation

## 2015-01-06 DIAGNOSIS — Y998 Other external cause status: Secondary | ICD-10-CM | POA: Diagnosis not present

## 2015-01-06 DIAGNOSIS — Y9389 Activity, other specified: Secondary | ICD-10-CM | POA: Insufficient documentation

## 2015-01-06 HISTORY — DX: Type 2 diabetes mellitus without complications: E11.9

## 2015-01-06 HISTORY — DX: Cerebral infarction, unspecified: I63.9

## 2015-01-06 MED ORDER — OXYCODONE-ACETAMINOPHEN 5-325 MG PO TABS
ORAL_TABLET | ORAL | Status: AC
  Filled 2015-01-06: qty 1

## 2015-01-06 MED ORDER — OXYCODONE-ACETAMINOPHEN 5-325 MG PO TABS: 1.0000 | ORAL_TABLET | Freq: Three times a day (TID) | ORAL | Status: AC | PRN

## 2015-01-06 MED ORDER — OXYCODONE-ACETAMINOPHEN 5-325 MG PO TABS
2.0000 | ORAL_TABLET | Freq: Once | ORAL | Status: AC
  Administered 2015-01-06: 2 via ORAL

## 2015-01-06 MED ORDER — OXYCODONE-ACETAMINOPHEN 5-325 MG PO TABS
ORAL_TABLET | ORAL | Status: AC
  Administered 2015-01-06: 2 via ORAL
  Filled 2015-01-06: qty 1

## 2015-01-06 NOTE — Discharge Instructions (Signed)
Take medication as prescribed. Keep foot and splint as well as use walker. Keep foot elevated as discussed and apply ice.  Follow-up with orthopedic as discussed. See above to call tomorrow to schedule appointment.  Return to the ER for new or worsening concerns.  Ankle Fracture A fracture is a break in a bone. The ankle joint is made up of three bones. These include the lower (distal)sections of your lower leg bones, called the tibia and fibula, along with a bone in your foot, called the talus. Depending on how bad the break is and if more than one ankle joint bone is broken, a cast or splint is used to protect and keep your injured bone from moving while it heals. Sometimes, surgery is required to help the fracture heal properly.  There are two general types of fractures:  Stable fracture. This includes a single fracture line through one bone, with no injury to ankle ligaments. A fracture of the talus that does not have any displacement (movement of the bone on either side of the fracture line) is also stable.  Unstable fracture. This includes more than one fracture line through one or more bones in the ankle joint. It also includes fractures that have displacement of the bone on either side of the fracture line. CAUSES  A direct blow to the ankle.   Quickly and severely twisting your ankle.  Trauma, such as a car accident or falling from a significant height. RISK FACTORS You may be at a higher risk of ankle fracture if:  You have certain medical conditions.  You are involved in high-impact sports.  You are involved in a high-impact car accident. SIGNS AND SYMPTOMS   Tender and swollen ankle.  Bruising around the injured ankle.  Pain on movement of the ankle.  Difficulty walking or putting weight on the ankle.  A cold foot below the site of the ankle injury. This can occur if the blood vessels passing through your injured ankle were also damaged.  Numbness in the foot  below the site of the ankle injury. DIAGNOSIS  An ankle fracture is usually diagnosed with a physical exam and X-rays. A CT scan may also be required for complex fractures. TREATMENT  Stable fractures are treated with a cast or splint and using crutches to avoid putting weight on your injured ankle. This is followed by an ankle strengthening program. Some patients require a special type of cast, depending on other medical problems they may have. Unstable fractures require surgery to ensure the bones heal properly. Your health care provider will tell you what type of fracture you have and the best treatment for your condition. HOME CARE INSTRUCTIONS   Review correct crutch use with your health care provider and use your crutches as directed. Safe use of crutches is extremely important. Misuse of crutches can cause you to fall or cause injury to nerves in your hands or armpits.  Do not put weight or pressure on the injured ankle until directed by your health care provider.  To lessen the swelling, keep the injured leg elevated while sitting or lying down.  Apply ice to the injured area:  Put ice in a plastic bag.  Place a towel between your cast and the bag.  Leave the ice on for 20 minutes, 2-3 times a day.  If you have a plaster or fiberglass cast:  Do not try to scratch the skin under the cast with any objects. This can increase your risk of skin  infection.  Check the skin around the cast every day. You may put lotion on any red or sore areas.  Keep your cast dry and clean.  If you have a plaster splint:  Wear the splint as directed.  You may loosen the elastic around the splint if your toes become numb, tingle, or turn cold or blue.  Do not put pressure on any part of your cast or splint; it may break. Rest your cast only on a pillow the first 24 hours until it is fully hardened.  Your cast or splint can be protected during bathing with a plastic bag sealed to your skin with  medical tape. Do not lower the cast or splint into water.  Take medicines as directed by your health care provider. Only take over-the-counter or prescription medicines for pain, discomfort, or fever as directed by your health care provider.  Do not drive a vehicle until your health care provider specifically tells you it is safe to do so.  If your health care provider has given you a follow-up appointment, it is very important to keep that appointment. Not keeping the appointment could result in a chronic or permanent injury, pain, and disability. If you have any problem keeping the appointment, call the facility for assistance. SEEK MEDICAL CARE IF: You develop increased swelling or discomfort. SEEK IMMEDIATE MEDICAL CARE IF:   Your cast gets damaged or breaks.  You have continued severe pain.  You develop new pain or swelling after the cast was put on.  Your skin or toenails below the injury turn blue or gray.  Your skin or toenails below the injury feel cold, numb, or have loss of sensitivity to touch.  There is a bad smell or pus draining from under the cast. MAKE SURE YOU:   Understand these instructions.  Will watch your condition.  Will get help right away if you are not doing well or get worse. Document Released: 06/30/2000 Document Revised: 07/08/2013 Document Reviewed: 01/30/2013 [facility] Patient Information 2015 [place], [place]. This information is not intended to replace advice given to you by your health care provider. Make sure you discuss any questions you have with your health care provider.

## 2015-01-06 NOTE — ED Notes (Signed)
Patient fell entering a vehicle, his foot "went under the car" and injured his ankle yesterday.

## 2015-01-06 NOTE — ED Provider Notes (Signed)
[facility] Emergency Department Provider Note  ____________________________________________  Time seen: Approximately 3:52 PM  I have reviewed the triage vital signs and the nursing notes.   HISTORY  Chief Complaint Ankle Pain   HPI Barry Jensen is a 69 y.o. male since the ER with bedside for the complaint of left ankle pain 2 days. Patient reports 2 days ago he was getting in his car and states that he accidentally rolled his left ankle and fell into a sitting position. States rolled ankle as he was getting in car at a bad angle. Patient denies other pain other than his left ankle. Denies head injury or loss of consciousness. Patient states that he was able to get himself up however he has had continued left ankle pain since. Patient states that he continues to ambulate but with pain.   that pain is currently 8 out of 10 to his left ankle. States that it is a throbbing pain worse with movement or ambulation. Denies other pain or injury.   Patient reports that he has chronic swelling to bilateral legs and states that that has been on that for years, denies changing in swelling except increased swelling adjacent to pain at ankle.. Denies chest pain, shortness of breath, weakness, abdominal pain, neck pain, back pain or other complaints. Denies pain in calfs or upper legs.    Past Medical History  Diagnosis Date  . Hypertension   . Diabetes mellitus without complication   . Stroke     There are no active problems to display for this patient.   No past surgical history on file.  Current Outpatient Rx  Name  Route  Sig  Dispense  Refill  . aspirin 81 MG chewable tablet   Oral   Chew 81 mg by mouth daily.         Marland Kitchen atenolol (TENORMIN) 50 MG tablet   Oral   Take 100 mg by mouth daily.         . ciprofloxacin (CIPRO) 500 MG tablet   Oral   Take 1 tablet (500 mg total) by mouth every 12 (twelve) hours.   14 tablet   0   . cyclobenzaprine  (FLEXERIL) 10 MG tablet   Oral   Take 10 mg by mouth 3 (three) times daily as needed. For muscle spasms         . furosemide (LASIX) 40 MG tablet   Oral   Take by mouth daily.         Marland Kitchen ibuprofen (ADVIL,MOTRIN) 600 MG tablet   Oral   Take 600 mg by mouth 2 (two) times daily as needed. For pain         . lactulose (CHRONULAC) 10 GM/15ML solution   Oral   Take 10 g by mouth 3 (three) times daily as needed. For constipation         .           Marland Kitchen NIFEdipine (PROCARDIA-XL/ADALAT-CC/NIFEDICAL-XL) 30 MG 24 hr tablet   Oral   Take 30 mg by mouth daily.         . ranitidine (ZANTAC) 150 MG tablet   Oral   Take 150 mg by mouth 2 (two) times daily.         Marland Kitchen senna-docusate (SENOKOT-S) 8.6-50 MG per tablet   Oral   Take 1 tablet by mouth 2 (two) times daily.         . sertraline (ZOLOFT) 100 MG tablet   Oral  Take 100 mg by mouth daily.         . Tamsulosin HCl (FLOMAX) 0.4 MG CAPS   Oral   Take 0.4 mg by mouth daily.         Marland Kitchen topiramate (TOPAMAX) 25 MG tablet   Oral   Take 25 mg by mouth at bedtime.           Allergies Terazosin  No family history on file.  Social History History  Substance Use Topics  . Smoking status: Current Every Day Smoker    Types: Cigarettes  . Smokeless tobacco: Not on file  . Alcohol Use: Yes    Review of Systems Constitutional: No fever/chills Eyes: No visual changes. ENT: No sore throat. Cardiovascular: Denies chest pain. Respiratory: Denies shortness of breath. Gastrointestinal: No abdominal pain.  No nausea, no vomiting.  No diarrhea.  No constipation. Genitourinary: Negative for dysuria. Musculoskeletal: Negative for back pain. Positive for left ankle pain. Skin: Negative for rash. Neurological: Negative for headaches, focal weakness or numbness.  10-point ROS otherwise negative.  ____________________________________________   PHYSICAL EXAM:  VITAL SIGNS: ED Triage Vitals  Enc Vitals Group     BP  01/06/15 1321 150/87 mmHg     Pulse Rate 01/06/15 1321 80     Resp --      Temp 01/06/15 1321 98.6 F (37 C)     Temp Source 01/06/15 1321 Oral     SpO2 01/06/15 1321 95 %     Weight 01/06/15 1321 259 lb (117.482 kg)     Height 01/06/15 1321 5\' 11"  (1.803 m)     Head Cir --      Peak Flow --      Pain Score 01/06/15 1322 8     Pain Loc --      Pain Edu? --      Excl. in [place]? --     Constitutional: Alert and oriented. Well appearing and in no acute distress. Eyes: Conjunctivae are normal. PERRL. EOMI. Head: Atraumatic. Nose: No congestion/rhinnorhea. Mouth/Throat: Mucous membranes are moist.  Oropharynx non-erythematous. Neck: No stridor.  No cervical spine tenderness to palpation. Hematological/Lymphatic/Immunilogical: No cervical lymphadenopathy. Cardiovascular: Normal rate, regular rhythm. Grossly normal heart sounds.  Good peripheral circulation. Respiratory: Normal respiratory effort.  No retractions. Lungs CTAB. Gastrointestinal: Soft and nontender.obese abdomen. No abdominal bruits. No CVA tenderness. Musculoskeletal: no cervical, thoracic, or lumbar tenderness. bilateral lower ankle and pedal edema 1+ per pt chronic, bilateral pedal pulses palpated and equal. Pedal pulses also verified with hand held dopper with easily found and equal pulses.  Left lateral ankle mod TTP and mod swelling and mild ecchymosis, medial left ankle mild TTP and mild ecchymosis. Pain with rotation and plantar flexion. Sensation and motor intact bilateral feet.  Neurologic:  Normal speech and language. No gross focal neurologic deficits are appreciated. Speech is normal. No gait instability. Skin:  Skin is warm, dry and intact. No rash noted. Psychiatric: Mood and affect are normal. Speech and behavior are normal.   RADIOLOGY  LEFT ANKLE COMPLETE - 3+ VIEW  COMPARISON: None.  FINDINGS: There is an oblique fracture at the distal fibula at the level of the ankle mortise. Diffuse soft tissue  swelling. No tibial abnormality. Small avulsed bone fragment off the medial talus.  IMPRESSION: Oblique distal left fibular fracture.  Small avulsed fragment off the medial talus.  Diffuse soft tissue swelling.   Electronically Signed By: Rolm Baptise M.D. On: 01/06/2015 15:07 ____________________________________________   PROCEDURES  Procedure(s) performed:  SPLINT APPLICATION Date/Time: 8:40 PM Authorized by: Marylene Land Consent: Verbal consent obtained. Risks and benefits: risks, benefits and alternatives were discussed Consent given by: patient Splint applied by: ed technician Location details: left ankle Splint type: posterior and stirrup ocl Supplies used: ocl and walker Post-procedure: The splinted body part was neurovascularly unchanged following the procedure. Patient tolerance: Patient tolerated the procedure well with no immediate complications.  ____________________________________________   INITIAL IMPRESSION / ASSESSMENT AND PLAN / ED COURSE  Pertinent labs & imaging results that were available during my care of the patient were reviewed by me and considered in my medical decision making (see chart for details).  Very well-appearing patient. No acute distress. Presents the ER status post mechanical fall from trying to get in his car 2 days ago. Denies other fall or other injury. Presents the ER for complaint of left ankle pain that is continual since fall. X-ray positive for oblique distal left fibular fracture as well as a small avulsed fragment of the medial talus with diffuse soft tissue swelling.  1620: Discussed patient as well as x-ray with Dr. Roland Rack orthopedics who also reviewed x-ray. Dr. Roland Rack also recommended to place patient in posterior as well as stirrup OCL and send patient home with a walker. Dr. Roland Rack recommends patient to elevate left leg and follow-up in the office next week.   Discussed this with patient and sister at bedside.  Patient verbalized understanding and agreed to plan. __________________________________________   FINAL CLINICAL IMPRESSION(S) / ED DIAGNOSES  Final diagnoses:  Fibula fracture, left, closed, initial encounter  Closed left ankle fracture, initial encounter  Fransico Meadow, NP 01/06/15 1707  Marylene Land, NP 01/06/15 1716  Orbie Pyo, MD 01/06/15 [phone]

## 2015-01-06 NOTE — ED Notes (Signed)
Positive pulses noted on both feet.Marland Kitchen

## 2017-09-24 ENCOUNTER — Emergency Department: Payer: Medicare PPO

## 2017-09-24 ENCOUNTER — Emergency Department
Admission: EM | Admit: 2017-09-24 | Discharge: 2017-09-24 | Disposition: A | Discharge status: Home / Self Care | Payer: Medicare PPO | Attending: Emergency Medicine | Admitting: Emergency Medicine

## 2017-09-24 ENCOUNTER — Encounter: Payer: Self-pay | Admitting: Radiology

## 2017-09-24 ENCOUNTER — Other Ambulatory Visit: Payer: Self-pay

## 2017-09-24 DIAGNOSIS — C801 Malignant (primary) neoplasm, unspecified: Secondary | ICD-10-CM | POA: Insufficient documentation

## 2017-09-24 DIAGNOSIS — I712 Thoracic aortic aneurysm, without rupture, unspecified: Secondary | ICD-10-CM

## 2017-09-24 DIAGNOSIS — F17210 Nicotine dependence, cigarettes, uncomplicated: Secondary | ICD-10-CM | POA: Diagnosis not present

## 2017-09-24 DIAGNOSIS — R079 Chest pain, unspecified: Secondary | ICD-10-CM

## 2017-09-24 DIAGNOSIS — C799 Secondary malignant neoplasm of unspecified site: Secondary | ICD-10-CM

## 2017-09-24 DIAGNOSIS — R0602 Shortness of breath: Secondary | ICD-10-CM | POA: Diagnosis not present

## 2017-09-24 DIAGNOSIS — Z8673 Personal history of transient ischemic attack (TIA), and cerebral infarction without residual deficits: Secondary | ICD-10-CM | POA: Diagnosis not present

## 2017-09-24 DIAGNOSIS — Z7982 Long term (current) use of aspirin: Secondary | ICD-10-CM | POA: Diagnosis not present

## 2017-09-24 DIAGNOSIS — E119 Type 2 diabetes mellitus without complications: Secondary | ICD-10-CM | POA: Diagnosis not present

## 2017-09-24 DIAGNOSIS — I10 Essential (primary) hypertension: Secondary | ICD-10-CM | POA: Diagnosis not present

## 2017-09-24 DIAGNOSIS — Z79899 Other long term (current) drug therapy: Secondary | ICD-10-CM | POA: Insufficient documentation

## 2017-09-24 LAB — COMPREHENSIVE METABOLIC PANEL
ALBUMIN: 3.7 g/dL (ref 3.5–5.0)
ALK PHOS: 118 U/L (ref 38–126)
ALT: 24 U/L (ref 17–63)
AST: 30 U/L (ref 15–41)
Anion gap: 13 (ref 5–15)
BILIRUBIN TOTAL: 0.8 mg/dL (ref 0.3–1.2)
BUN: 18 mg/dL (ref 6–20)
CALCIUM: 9.2 mg/dL (ref 8.9–10.3)
CO2: 23 mmol/L (ref 22–32)
CREATININE: 1.29 mg/dL — AB (ref 0.61–1.24)
Chloride: 105 mmol/L (ref 101–111)
GFR calc Af Amer: >60 mL/min (ref >60)
GFR calc non Af Amer: 53 mL/min — ABNORMAL LOW (ref >60)
GLUCOSE: 175 mg/dL — AB (ref 65–99)
Potassium: 4 mmol/L (ref 3.5–5.1)
SODIUM: 141 mmol/L (ref 135–145)
TOTAL PROTEIN: 7.4 g/dL (ref 6.5–8.1)

## 2017-09-24 LAB — CBC
HEMATOCRIT: 37.1 % — AB (ref 40.0–52.0)
HEMOGLOBIN: 12.3 g/dL — AB (ref 13.0–18.0)
MCH: 27.4 pg (ref 26.0–34.0)
MCHC: 33.1 g/dL (ref 32.0–36.0)
MCV: 82.8 fL (ref 80.0–100.0)
Platelets: 222 10*3/uL (ref 150–440)
RBC: 4.48 MIL/uL (ref 4.40–5.90)
RDW: 15 % — ABNORMAL HIGH (ref 11.5–14.5)
WBC: 4.5 10*3/uL (ref 3.8–10.6)

## 2017-09-24 LAB — TROPONIN I: Troponin I: <0.03 ng/mL (ref <0.03)

## 2017-09-24 LAB — BRAIN NATRIURETIC PEPTIDE: B Natriuretic Peptide: 30 pg/mL (ref 0.0–100.0)

## 2017-09-24 MED ORDER — HYDROCODONE-ACETAMINOPHEN 5-325 MG PO TABS: 1.0000 | ORAL_TABLET | Freq: Four times a day (QID) | ORAL | 0 refills | Status: AC | PRN

## 2017-09-24 MED ORDER — FENTANYL CITRATE (PF) 100 MCG/2ML IJ SOLN
100.0000 ug | Freq: Once | INTRAMUSCULAR | Status: AC
  Administered 2017-09-24: 100 ug via INTRAVENOUS
  Filled 2017-09-24: qty 2

## 2017-09-24 MED ORDER — IOPAMIDOL (ISOVUE-370) INJECTION 76%
100.0000 mL | Freq: Once | INTRAVENOUS | Status: AC | PRN
  Administered 2017-09-24: 100 mL via INTRAVENOUS

## 2017-09-24 NOTE — ED Triage Notes (Signed)
First Nurse Note:  Arrives with C/O  Chest pain and SOB x 3 days.  Patient is a [facility] and has an appointment scheduled for Friday for same complaint.

## 2017-09-24 NOTE — ED Triage Notes (Signed)
States chest pain and sob x 3 days. States began on R side, now across top of chest.

## 2017-09-24 NOTE — Discharge Instructions (Signed)
Fortunately today your CT scan did not show an aortic dissection or a blood clot in your lungs.  It is still critically important that you follow-up with your primary care physician tomorrow at 830 as scheduled.  Return to the emergency department sooner for any concerns whatsoever.  It was a pleasure to take care of you today, and thank you for coming to our emergency department.  If you have any questions or concerns before leaving please ask the nurse to grab me and I'm more than happy to go through your aftercare instructions again.  If you were prescribed any opioid pain medication today such as Norco, Vicodin, Percocet, morphine, hydrocodone, or oxycodone please make sure you do not drive when you are taking this medication as it can alter your ability to drive safely.  If you have any concerns once you are home that you are not improving or are in fact getting worse before you can make it to your follow-up appointment, please do not hesitate to call 911 and come back for further evaluation.  Darel Hong, MD  Results for orders placed or performed during the hospital encounter of 09/24/17  CBC  Result Value Ref Range   WBC 4.5 3.8 - 10.6 K/uL   RBC 4.48 4.40 - 5.90 MIL/uL   Hemoglobin 12.3 (L) 13.0 - 18.0 g/dL   HCT 37.1 (L) 40.0 - 52.0 %   MCV 82.8 80.0 - 100.0 fL   MCH 27.4 26.0 - 34.0 pg   MCHC 33.1 32.0 - 36.0 g/dL   RDW 15.0 (H) 11.5 - 14.5 %   Platelets 222 150 - 440 K/uL  Troponin I  Result Value Ref Range   Troponin I <0.03 <0.03 ng/mL  Comprehensive metabolic panel  Result Value Ref Range   Sodium 141 135 - 145 mmol/L   Potassium 4.0 3.5 - 5.1 mmol/L   Chloride 105 101 - 111 mmol/L   CO2 23 22 - 32 mmol/L   Glucose, Bld 175 (H) 65 - 99 mg/dL   BUN 18 6 - 20 mg/dL   Creatinine, Ser 1.29 (H) 0.61 - 1.24 mg/dL   Calcium 9.2 8.9 - 10.3 mg/dL   Total Protein 7.4 6.5 - 8.1 g/dL   Albumin 3.7 3.5 - 5.0 g/dL   AST 30 15 - 41 U/L   ALT 24 17 - 63 U/L   Alkaline  Phosphatase 118 38 - 126 U/L   Total Bilirubin 0.8 0.3 - 1.2 mg/dL   GFR calc non Af Amer 53 (L) >60 mL/min   GFR calc Af Amer >60 >60 mL/min   Anion gap 13 5 - 15  Brain natriuretic peptide  Result Value Ref Range   B Natriuretic Peptide 30.0 0.0 - 100.0 pg/mL   Dg Chest 2 View  Result Date: 09/24/2017 CLINICAL DATA:  Chest pain EXAM: CHEST - 2 VIEW COMPARISON:  10/26/2007 chest radiograph. FINDINGS: Stable cardiomediastinal silhouette with normal heart size. No pneumothorax. No pleural effusion. There are questionable scattered small nodular opacities in both lungs on the frontal view, less apparent on the lateral view. IMPRESSION: Questionable scattered small nodular opacities in both lungs on the frontal view, less apparent on the lateral view. While potentially artifactual, chest CT is advised to exclude pulmonary nodules. Electronically Signed   By: Ilona Sorrel M.D.   On: 09/24/2017 16:48   Ct Angio Chest/abd/pel For Dissection W And/or W/wo  Result Date: 09/24/2017 CLINICAL DATA:  Sudden onset of chest pain from right chest of back.  History of renal and prostate cancer. EXAM: CT ANGIOGRAPHY CHEST, ABDOMEN AND PELVIS TECHNIQUE: Multidetector CT imaging through the chest, abdomen and pelvis was performed using the standard protocol during bolus administration of intravenous contrast. Multiplanar reconstructed images and MIPs were obtained and reviewed to evaluate the vascular anatomy. CONTRAST:  184mL ISOVUE-370 IOPAMIDOL (ISOVUE-370) INJECTION 76% COMPARISON:  Same day CXR FINDINGS: CTA CHEST FINDINGS Cardiovascular: No acute pulmonary embolus. 4.4 cm ascending thoracic aortic aneurysm minimal aortic atherosclerosis. No dissection. Heart size is within normal limits. No pericardial effusion. Mild three-vessel coronary arteriosclerosis along the left circumflex, lad and RCA. Mediastinum/Nodes: Normal branch pattern of the great vessels. No lymphadenopathy. Patent midline trachea and mainstem  bronchus. Esophagus is unremarkable. Lungs/Pleura: Tiny 4 mm pleural-based lingular nodule. No dominant mass. There is dependent atelectasis in the right lower lobe with subsegmental atelectasis and/or scarring in the lingula and left lobe. 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 These likely accounted for the subtle densities noted on the chest radiograph. No lytic abnormality is identified. Review of the MIP images confirms the above findings. CTA ABDOMEN AND PELVIS FINDINGS VASCULAR Aorta: Normal caliber aorta without aneurysm, dissection, vasculitis or significant stenosis. Celiac: Patent without evidence of aneurysm, dissection, vasculitis or significant stenosis. SMA: Patent without evidence of aneurysm, dissection, vasculitis or significant stenosis. Renals: Patent single renal arteries to both kidneys. No aneurysm or dissection. No evidence of fibromuscular dysplasia. IMA: Patent without evidence of aneurysm, dissection, vasculitis or significant stenosis. Inflow: Patent without evidence of aneurysm, dissection, vasculitis or significant stenosis. Veins: No obvious venous abnormality within the limitations of this arterial phase study. Review of the MIP images confirms the above findings. NON-VASCULAR Hepatobiliary: Subtle areas of hypervascular blush involving the right hepatic lobe with a 2 cm lesion in the anterior left hepatic lobe medial segment demonstrating peripheral puddling of contrast, more likely representing a hemangioma. No biliary dilatation is noted. Gallbladder is contracted. Pancreas: No ductal dilatation or mass.  Inflammation. Spleen: Normal size spleen without mass. Heterogeneous enhancement likely from timing of contrast. Adrenals/Urinary Tract: Normal bilateral adrenal glands. Bilateral renal cortical thinning with cortical scarring  anteriorly in the upper pole the right kidney. 14 mm indeterminate hypodensity in the posterior aspect of the left interpolar kidney. No nephrolithiasis nor hydronephrosis. Physiologic distention of the urinary bladder without focal mural thickening. Stomach/Bowel: Stomach is within normal limits. Small hiatal hernia. Appendix appears normal. No evidence of bowel wall thickening, distention, or inflammatory changes. Lymphatic: Bulky para-aortic adenopathy within the mid abdomen, the largest conglomeration measuring 7.4 x 4.4 x 5 cm. Reproductive: Prostate is normal in size with peripheral zone calcifications. Seminal vesicles are unremarkable. No pelvic sidewall invasion is seen. No operator, internal nor external iliac adenopathy. Other: Small fat containing periumbilical hernia. 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 Lumbar spondylosis with multilevel degenerative disc disease and facet arthropathy. Review of the MIP images confirms the above findings. IMPRESSION: Chest CT: 1. 4.4 cm ascending thoracic aortic aneurysm with minimal aortic atherosclerosis. No aortic dissection. 2. Three-vessel coronary arteriosclerosis. 3. No acute pulmonary embolus. 4. Tiny 4 mm pleural-based nodular density in the lingula. 5. 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 CT abdomen/pelvis: 1. Numerous osteoblastic metastatic foci within the included lumbar spine and bony pelvis. 2. Bulky para-aortic metastatic lymphadenopathy. The largest lymph node conglomeration measures 7.4  x 4.4 x 5 cm anterior to the infrarenal aorta. 3. No infrarenal abdominal aortic aneurysm or dissection. Patent branch vessels. 4. 14 mm indeterminate left renal hypodensity in the interpolar kidney. Dedicated CT or MRI with renal tumor protocol given history of prior renal cancer is recommended for better assessment. Electronically  Signed   By: Ashley Royalty M.D.   On: 09/24/2017 19:57

## 2017-09-24 NOTE — ED Provider Notes (Signed)
[facility] Emergency Department Provider Note  ____________________________________________   First MD Initiated Contact with Patient 09/24/17 1813     (approximate)  I have reviewed the triage vital signs and the nursing notes.   HISTORY  Chief Complaint Chest Pain   HPI Barry Jensen is a 72 y.o. male who self presents the emergency department with 4 days of sudden onset severe right chest pain radiating straight to his back and across his chest.  It is associated with shortness of breath.  The pain is severe sharp aching.  Constant.  Nonexertional.  Nothing in particular seems to make it better or worse.  He has never had pain like this before.  He has a past medical history of diabetes hypertension and stroke.  Past Medical History:  Diagnosis Date  . Diabetes mellitus without complication ([place])   . Hypertension   . Stroke [facility])     There are no active problems to display for this patient.     Prior to Admission medications   Medication Sig Start Date End Date Taking? Authorizing Provider  aspirin 81 MG chewable tablet Chew 81 mg by mouth daily.   Yes [provider]  atenolol (TENORMIN) 50 MG tablet Take 100 mg by mouth daily.   Yes [provider]  furosemide (LASIX) 40 MG tablet Take 40 mg by mouth daily.    Yes [provider]  NIFEdipine (PROCARDIA-XL/ADALAT-CC/NIFEDICAL-XL) 30 MG 24 hr tablet Take 30 mg by mouth daily.   Yes [provider]  ranitidine (ZANTAC) 150 MG tablet Take 150 mg by mouth 2 (two) times daily.   Yes [provider]  senna-docusate (SENOKOT-S) 8.6-50 MG per tablet Take 1 tablet by mouth 2 (two) times daily.   Yes [provider]  sertraline (ZOLOFT) 100 MG tablet Take 100 mg by mouth daily.   Yes [provider]  Tamsulosin HCl (FLOMAX) 0.4 MG CAPS Take 0.4 mg by mouth daily.   Yes [provider]  topiramate (TOPAMAX) 25 MG tablet Take 25 mg  by mouth at bedtime.   Yes [provider]  ciprofloxacin (CIPRO) 500 MG tablet Take 1 tablet (500 mg total) by mouth every 12 (twelve) hours. Patient not taking: Reported on 09/24/2017 06/14/12   Malvin Johns, MD  cyclobenzaprine (FLEXERIL) 10 MG tablet Take 10 mg by mouth 3 (three) times daily as needed. For muscle spasms    [provider]  HYDROcodone-acetaminophen (NORCO) 5-325 MG tablet Take 1 tablet by mouth every 6 (six) hours as needed for up to 7 doses for severe pain. 09/24/17   Darel Hong, MD  ibuprofen (ADVIL,MOTRIN) 600 MG tablet Take 600 mg by mouth 2 (two) times daily as needed. For pain    [provider]  oxyCODONE-acetaminophen (ROXICET) 5-325 MG per tablet Take 1 tablet by mouth every 8 (eight) hours as needed for moderate pain or severe pain (Do not drive or operate heavy machinery while taking as can cause drowsiness.). 01/06/15   Marylene Land, NP    Allergies Terazosin  No family history on file.  Social History Social History   Tobacco Use  . Smoking status: Current Every Day Smoker    Types: Cigarettes  Substance Use Topics  . Alcohol use: Yes  . Drug use: No    Review of Systems Constitutional: No fever/chills Eyes: No visual changes. ENT: No sore throat. Cardiovascular: Positive for chest pain. Respiratory: Positive for shortness of breath. Gastrointestinal: No abdominal pain.  No  nausea, no vomiting.  No diarrhea.  No constipation. Genitourinary: Negative for dysuria. Musculoskeletal: Negative for back pain. Skin: Negative for rash. Neurological: Negative for headaches, focal weakness or numbness.   ____________________________________________   PHYSICAL EXAM:  VITAL SIGNS: ED Triage Vitals  Enc Vitals Group     BP 09/24/17 1609 (!) 142/80     Pulse Rate 09/24/17 1609 78     Resp 09/24/17 1609 20     Temp 09/24/17 1609 98.2 F (36.8 C)     Temp Source 09/24/17 1609 Oral     SpO2 09/24/17 1609 96 %      Weight 09/24/17 1610 285 lb (129.3 kg)     Height 09/24/17 1610 5\' 11"  (1.803 m)     Head Circumference --      Peak Flow --      Pain Score 09/24/17 1610 10     Pain Loc --      Pain Edu? --      Excl. in [place]? --     Constitutional: Alert and oriented x4 rocking back and forth extremely uncomfortable appearing holding his right chest Eyes: PERRL EOMI. Head: Atraumatic. Nose: No congestion/rhinnorhea. Mouth/Throat: No trismus Neck: No stridor.   Cardiovascular: Normal rate, regular rhythm. Grossly normal heart sounds.  Good peripheral circulation. Respiratory: Increased respiratory effort.  No retractions. Lungs CTAB and moving good air Gastrointestinal: Soft nontender Musculoskeletal: No lower extremity edema   Neurologic:  Normal speech and language. No gross focal neurologic deficits are appreciated. Skin:  Skin is warm, dry and intact. No rash noted. Psychiatric: Mood and affect are normal. Speech and behavior are normal.    ____________________________________________   DIFFERENTIAL includes but not limited to  Aortic dissection, pulmonary embolism, acute coronary syndrome, myocarditis, pericarditis ____________________________________________   LABS (all labs ordered are listed, but only abnormal results are displayed)  Labs Reviewed  CBC - Abnormal; Notable for the following components:      Result Value   Hemoglobin 12.3 (*)    HCT 37.1 (*)    RDW 15.0 (*)    All other components within normal limits  COMPREHENSIVE METABOLIC PANEL - Abnormal; Notable for the following components:   Glucose, Bld 175 (*)    Creatinine, Ser 1.29 (*)    GFR calc non Af Amer 53 (*)    All other components within normal limits  TROPONIN I  BRAIN NATRIURETIC PEPTIDE    Lab work reviewed by me with mild increase in creatinine __________________________________________  EKG  ED ECG REPORT I, Darel Hong, the attending physician, personally viewed and interpreted this  ECG.  Date: 09/24/2017 EKG Time:  Rate: 80 Rhythm: normal sinus rhythm QRS Axis: normal Intervals: normal ST/T Wave abnormalities: normal Narrative Interpretation: no evidence of acute ischemia  ____________________________________________  RADIOLOGY  Chest x-ray reviewed by me concerning for possible pulmonary nodules ____________________________________________   PROCEDURES  Procedure(s) performed: no  Procedures  Critical Care performed: no  Observation: no ____________________________________________   INITIAL IMPRESSION / ASSESSMENT AND PLAN / ED COURSE  Pertinent labs & imaging results that were available during my care of the patient were reviewed by me and considered in my medical decision making (see chart for details).  Patient arrives with elevated respiratory rate and quite uncomfortable appearing with sudden onset severe right chest pain radiating straight to his back with a history of hypertension raising concern for aortic dissection.  IV fentanyl and CT angiogram now.     Fortunately the patient CT scan is negative  for aortic dissection.  It does show a 4.4 cm proximal thoracic aortic aneurysm with no evidence of bleed.  His pain is adequately controlled at this point and he has primary care follow-up tomorrow morning at 8:30 in the morning and less than 12 hours.  I had a lengthy discussion with the patient regarding his diagnosis today and the importance of following up.  As he is in the [employer] system I will provide him with a printed copy of his CT scan.  Strict return precautions have been given and the patient and his wife verbalized understanding and agreement with the plan. ____________________________________________   FINAL CLINICAL IMPRESSION(S) / ED DIAGNOSES  Final diagnoses:  Chest pain, unspecified type  Thoracic aortic aneurysm without rupture ([place])  Metastatic cancer ([place])      NEW MEDICATIONS STARTED DURING THIS  VISIT:  Discharge Medication List as of 09/24/2017  8:25 PM    START taking these medications   Details  HYDROcodone-acetaminophen (NORCO) 5-325 MG tablet Take 1 tablet by mouth every 6 (six) hours as needed for up to 7 doses for severe pain., Starting Mon 09/24/2017, Print         Note:  This document was prepared using Dragon voice recognition software and may include unintentional dictation errors.     Darel Hong, MD 09/27/17 1324

## 2017-09-24 NOTE — ED Notes (Signed)
Patient returned from CT

## 2017-09-24 NOTE — ED Notes (Signed)
Patient transported to CT 

## 2018-03-17 DEATH — deceased
